# Patient Record
Sex: Male | Born: 1985
Health system: Southern US, Community
[De-identification: ages and names within clinical notes are randomized; demographics above are authoritative.]

## PROBLEM LIST (undated history)

## (undated) DIAGNOSIS — J45909 Unspecified asthma, uncomplicated: Secondary | ICD-10-CM

---

## 2008-01-10 ENCOUNTER — Emergency Department (HOSPITAL_COMMUNITY): Admission: EM | Admit: 2008-01-10 | Discharge: 2008-01-10 | Payer: Self-pay | Admitting: Emergency Medicine

## 2009-10-04 ENCOUNTER — Encounter: Admission: RE | Admit: 2009-10-04 | Discharge: 2009-10-04 | Payer: Self-pay | Admitting: Infectious Diseases

## 2016-01-23 DIAGNOSIS — J4541 Moderate persistent asthma with (acute) exacerbation: Secondary | ICD-10-CM | POA: Diagnosis not present

## 2016-12-23 ENCOUNTER — Encounter: Payer: Self-pay | Admitting: Emergency Medicine

## 2016-12-23 ENCOUNTER — Emergency Department
Admission: EM | Admit: 2016-12-23 | Discharge: 2016-12-23 | Disposition: A | Discharge status: Home / Self Care | Payer: BLUE CROSS/BLUE SHIELD | Attending: Emergency Medicine | Admitting: Emergency Medicine

## 2016-12-23 DIAGNOSIS — R0603 Acute respiratory distress: Secondary | ICD-10-CM | POA: Diagnosis not present

## 2016-12-23 DIAGNOSIS — J4551 Severe persistent asthma with (acute) exacerbation: Secondary | ICD-10-CM | POA: Diagnosis not present

## 2016-12-23 HISTORY — DX: Unspecified asthma, uncomplicated: J45.909

## 2016-12-23 MED ORDER — IPRATROPIUM-ALBUTEROL 0.5-2.5 (3) MG/3ML IN SOLN
9.0000 mL | Freq: Once | RESPIRATORY_TRACT | Status: AC
  Administered 2016-12-23: 9 mL via RESPIRATORY_TRACT

## 2016-12-23 MED ORDER — MAGNESIUM SULFATE 4 GM/100ML IV SOLN
INTRAVENOUS | Status: AC
  Administered 2016-12-23: 2 g via INTRAVENOUS
  Filled 2016-12-23: qty 100

## 2016-12-23 MED ORDER — METHYLPREDNISOLONE SODIUM SUCC 125 MG IJ SOLR
125.0000 mg | Freq: Once | INTRAMUSCULAR | Status: AC
  Administered 2016-12-23: 125 mg via INTRAVENOUS

## 2016-12-23 MED ORDER — ALBUTEROL SULFATE HFA 108 (90 BASE) MCG/ACT IN AERS: 2.0000 | INHALATION_SPRAY | Freq: Four times a day (QID) | RESPIRATORY_TRACT | 0 refills | Status: AC | PRN

## 2016-12-23 MED ORDER — PREDNISONE 20 MG PO TABS: 60.0000 mg | ORAL_TABLET | Freq: Every day | ORAL | 0 refills | Status: AC

## 2016-12-23 MED ORDER — METHYLPREDNISOLONE SODIUM SUCC 125 MG IJ SOLR
INTRAMUSCULAR | Status: AC
  Administered 2016-12-23: 125 mg via INTRAVENOUS
  Filled 2016-12-23: qty 2

## 2016-12-23 MED ORDER — MAGNESIUM SULFATE 2 GM/50ML IV SOLN
2.0000 g | Freq: Once | INTRAVENOUS | Status: AC
  Administered 2016-12-23: 2 g via INTRAVENOUS
  Filled 2016-12-23: qty 50

## 2016-12-23 NOTE — ED Notes (Signed)
Pt resting in bed playing on phone. VSS. Will continue to monitor for further patient needs.

## 2016-12-23 NOTE — ED Notes (Signed)
Pt visitor at bedside at this time. Pt is noted to be sitting up in bed speaking in complete sentences at this time without difficulty.

## 2016-12-23 NOTE — ED Notes (Signed)
NAD noted at time of D/C. Pt denies questions or concerns. Pt ambulatory to the lobby at this time.  

## 2016-12-23 NOTE — ED Triage Notes (Signed)
Pt bib employer from work d/t asthma exacerbation. Pt pulled from vehicle, unable to speak in complete sentences, pt taken to room 10. MD at bedside, respiratory called, nurse and EDT in room. Pts RR 41.

## 2016-12-23 NOTE — ED Notes (Signed)
This RN to bedside at this time. Pt in bed doing a breathing treatment. Pt able to nod head to answer questions.

## 2016-12-23 NOTE — ED Provider Notes (Addendum)
[facility] Emergency Department Provider Note   ____________________________________________   First MD Initiated Contact with Patient 12/23/16 [phone]     (approximate)  I have reviewed the triage vital signs and the nursing notes.   HISTORY  Chief Complaint Respiratory Distress  History Limited by respiratory distress  HPI Eddie Lawrence is a 31 y.o. male who was brought in to the emergency department with severe respiratory distress. The patient was wheeled back to a room and unable to speak. According to staff he was dropped off by his boss at work. The patient does have a history of asthma and ran out of his albuterol inhaler. He reports that he had been having some shortness of breath for a few hours but it became acutely worse. He had planned to go to urgent care to get a refill but was unable to make it. The patient denies any cough or fevers and had been feeling fine otherwise. He has no chest pain no nausea no vomiting. When he initially arrived again he was in some severe acute respiratory distress.   Past Medical History:  Diagnosis Date  . Asthma     There are no active problems to display for this patient.   History reviewed. No pertinent surgical history.  Prior to Admission medications   Medication Sig Start Date End Date Taking? Authorizing Provider  albuterol (PROVENTIL HFA;VENTOLIN HFA) 108 (90 Base) MCG/ACT inhaler Inhale 2 puffs into the lungs every 6 (six) hours as needed. 12/23/16   Rebecka Apley, MD  predniSONE (DELTASONE) 20 MG tablet Take 3 tablets (60 mg total) by mouth daily. 12/23/16   Rebecka Apley, MD    Allergies Patient has no known allergies.  No family history on file.  Social History Social History  Substance Use Topics  . Smoking status: Never Smoker  . Smokeless tobacco: Not on file  . Alcohol use Yes    Review of Systems  Constitutional: No fever/chills Eyes: No visual changes. ENT: No  sore throat. Cardiovascular: Denies chest pain. Respiratory:  shortness of breath. Gastrointestinal: No abdominal pain.  No nausea, no vomiting.  No diarrhea.  No constipation. Genitourinary: Negative for dysuria. Musculoskeletal: Negative for back pain. Skin: Negative for rash. Neurological: Negative for headaches, focal weakness or numbness.   ____________________________________________   PHYSICAL EXAM:  VITAL SIGNS: ED Triage Vitals [12/23/16 0658]  Enc Vitals Group     BP      Pulse Rate 98     Resp 39     Temp      Temp src      SpO2 100 %     Weight      Height      Head Circumference      Peak Flow      Pain Score      Pain Loc      Pain Edu?      Excl. in GC?     Constitutional: Alert and oriented. the patient is disoriented appearing in severe respiratory distress Eyes: Conjunctivae are normal. PERRL. EOMI. Head: Atraumatic. Nose: No congestion/rhinnorhea. Mouth/Throat: Mucous membranes are moist.  Oropharynx non-erythematous. Cardiovascular: tachycardia, regular rhythm. Grossly normal heart sounds.  Good peripheral circulation. Respiratory: increased respiratory effort. subcostal retractions. severe expiratory wheezes in all lung fields Gastrointestinal: Soft and nontender. No distention. Positive bowel sounds Musculoskeletal: No lower extremity tenderness nor edema.   Neurologic:  the patient is unable to speak due to his respiratory distress Skin:  Skin is  warm, dry and intact.  Psychiatric: Mood and affect are normal.   ____________________________________________   LABS (all labs ordered are listed, but only abnormal results are displayed)  Labs Reviewed - No data to display ____________________________________________  EKG  ED ECG REPORT I, Rebecka Apley, the attending physician, personally viewed and interpreted this ECG.   Date: 12/23/2016  EKG Time: 657  Rate: 96  Rhythm: normal sinus rhythm  Axis: normal  Intervals:none  ST&T  Change: none  ____________________________________________  RADIOLOGY  No results found.  ____________________________________________   PROCEDURES  Procedure(s) performed: None  Procedures  Critical Care performed: Yes, see critical care note(s)   CRITICAL CARE Performed by: Lucrezia Europe P   Total critical care time: 30 minutes  Critical care time was exclusive of separately billable procedures and treating other patients.  Critical care was necessary to treat or prevent imminent or life-threatening deterioration.  Critical care was time spent personally by me on the following activities: development of treatment plan with patient and/or surrogate as well as nursing, discussions with consultants, evaluation of patient's response to treatment, examination of patient, obtaining history from patient or surrogate, ordering and performing treatments and interventions, ordering and review of laboratory studies, ordering and review of radiographic studies, pulse oximetry and re-evaluation of patient's condition.   ____________________________________________   INITIAL IMPRESSION / ASSESSMENT AND PLAN / ED COURSE  Pertinent labs & imaging results that were available during my care of the patient were reviewed by me and considered in my medical decision making (see chart for details).  this is a 31 year old male who comes into the hospital today with shortness of breath the patient has a history of asthma and I fear that the patient is having a severe asthma exacerbation.  when the patient arrived in the emergency department I quickly assessed if he had any allergies and he denied. We immediately started 3 DuoNeb treatments on the patient. We started an IV and gave him a dose of Solu-Medrol as well as some magnesium sulfate. After approximately 15 minutes the patient's breathing did slow and he reports that his breathing was feeling improved. The patient states that he did not  have any cough or any chest pain or any other symptoms. He reports that this was truly has asthma. Given the patient's history and his rapid improvement we did not check any blood work or any x-rays. After receiving the medications the patient's breathing difficulty was completely resolved and the patient states that he was ready to be discharged home. I will write him for some prednisone as well as an inhaler for home. The patient will be discharged. his asthma attack has completely resolved.      ____________________________________________   FINAL CLINICAL IMPRESSION(S) / ED DIAGNOSES  Final diagnoses:  Severe persistent asthma with exacerbation      NEW MEDICATIONS STARTED DURING THIS VISIT:  New Prescriptions   ALBUTEROL (PROVENTIL HFA;VENTOLIN HFA) 108 (90 BASE) MCG/ACT INHALER    Inhale 2 puffs into the lungs every 6 (six) hours as needed.   PREDNISONE (DELTASONE) 20 MG TABLET    Take 3 tablets (60 mg total) by mouth daily.     Note:  This document was prepared using Dragon voice recognition software and may include unintentional dictation errors.    Rebecka Apley, MD 12/23/16 1610    Rebecka Apley, MD 12/23/16 [phone]

## 2016-12-23 NOTE — Discharge Instructions (Signed)
Please follow up with the acute care clinic for further evaluation of your asthma. You may need a controller medication. At this time your symptoms are improved. Please use your albuterol inhaler every 6 hours for the next 24 hours to keep your airways open.

## 2017-01-10 ENCOUNTER — Encounter (HOSPITAL_COMMUNITY): Payer: Self-pay

## 2017-01-10 ENCOUNTER — Emergency Department (HOSPITAL_COMMUNITY)
Admission: EM | Admit: 2017-01-10 | Discharge: 2017-01-10 | Disposition: A | Discharge status: Home / Self Care | Payer: BLUE CROSS/BLUE SHIELD | Attending: Emergency Medicine | Admitting: Emergency Medicine

## 2017-01-10 ENCOUNTER — Emergency Department (HOSPITAL_COMMUNITY): Payer: BLUE CROSS/BLUE SHIELD

## 2017-01-10 DIAGNOSIS — Y929 Unspecified place or not applicable: Secondary | ICD-10-CM | POA: Insufficient documentation

## 2017-01-10 DIAGNOSIS — Y9389 Activity, other specified: Secondary | ICD-10-CM | POA: Diagnosis not present

## 2017-01-10 DIAGNOSIS — S42331A Displaced oblique fracture of shaft of humerus, right arm, initial encounter for closed fracture: Secondary | ICD-10-CM

## 2017-01-10 DIAGNOSIS — S4991XA Unspecified injury of right shoulder and upper arm, initial encounter: Secondary | ICD-10-CM | POA: Diagnosis not present

## 2017-01-10 DIAGNOSIS — Z79899 Other long term (current) drug therapy: Secondary | ICD-10-CM | POA: Diagnosis not present

## 2017-01-10 DIAGNOSIS — J45909 Unspecified asthma, uncomplicated: Secondary | ICD-10-CM | POA: Diagnosis not present

## 2017-01-10 DIAGNOSIS — Y998 Other external cause status: Secondary | ICD-10-CM | POA: Diagnosis not present

## 2017-01-10 DIAGNOSIS — R2231 Localized swelling, mass and lump, right upper limb: Secondary | ICD-10-CM | POA: Diagnosis not present

## 2017-01-10 DIAGNOSIS — M79621 Pain in right upper arm: Secondary | ICD-10-CM | POA: Diagnosis not present

## 2017-01-10 DIAGNOSIS — R52 Pain, unspecified: Secondary | ICD-10-CM

## 2017-01-10 MED ORDER — OXYCODONE-ACETAMINOPHEN 5-325 MG PO TABS
1.0000 | ORAL_TABLET | ORAL | Status: DC | PRN
  Administered 2017-01-10: 1 via ORAL

## 2017-01-10 MED ORDER — OXYCODONE-ACETAMINOPHEN 5-325 MG PO TABS: 1.0000 | ORAL_TABLET | Freq: Four times a day (QID) | ORAL | 0 refills | Status: AC | PRN

## 2017-01-10 MED ORDER — OXYCODONE-ACETAMINOPHEN 5-325 MG PO TABS
ORAL_TABLET | ORAL | Status: AC
  Filled 2017-01-10: qty 1

## 2017-01-10 MED ORDER — IBUPROFEN 600 MG PO TABS: 600.0000 mg | ORAL_TABLET | Freq: Four times a day (QID) | ORAL | 0 refills | Status: AC | PRN

## 2017-01-10 NOTE — Discharge Instructions (Signed)
Please read and follow all provided instructions.  Your diagnoses today include:  1. Pain   2. Closed displaced oblique fracture of shaft of right humerus, initial encounter     Tests performed today include:  An x-ray of the affected area - does NOT show any broken bones  Vital signs. See below for your results today.   Medications prescribed:   Ibuprofen (Motrin, Advil) - anti-inflammatory pain medication  Do not exceed  ibuprofen every 6 hours, take with food  You have been prescribed an anti-inflammatory medication or NSAID. Take with food. Take smallest effective dose for the shortest duration needed for your pain. Stop taking if you experience stomach pain or vomiting.    Percocet (oxycodone/acetaminophen) - narcotic pain medication  DO NOT drive or perform any activities that require you to be awake and alert because this medicine can make you drowsy. BE VERY CAREFUL not to take multiple medicines containing Tylenol (also called acetaminophen). Doing so can lead to an overdose which can damage your liver and cause liver failure and possibly death.  Take any prescribed medications only as directed.  Home care instructions:   Follow any educational materials contained in this packet  Follow R.I.C.E. Protocol:  R - rest your injury   I  - use ice on injury without applying directly to skin  C - compress injury with bandage or splint  E - elevate the injury as much as possible  Follow-up instructions: Please follow-up the provided orthopedic physician as directed.   Return instructions:   Please return if your fingers are numb or tingling, appear gray or blue, or you have severe pain (also elevate the arm and loosen splint or wrap if you were given one)  Please return to the Emergency Department if you experience worsening symptoms.   Please return if you have any other emergent concerns.  Additional Information:  Your vital signs today were: BP (!)  143/91 (BP Location: Left Arm)    Pulse 79    Temp 98.5 F (36.9 C) (Oral)    Resp 16    SpO2 100%  If your blood pressure (BP) was elevated above 135/85 this visit, please have this repeated by your doctor within one month. --------------
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
## 2017-01-10 NOTE — Progress Notes (Signed)
Orthopedic Tech Progress Note Patient Details:  Eddie Lawrence [birth year] [id number]  Ortho Devices Type of Ortho Device: Ace wrap, Sling immobilizer, Coapt, Stirrup splint Ortho Device/Splint Location: rue Ortho Device/Splint Interventions: Application   Eddie Lawrence 01/10/2017, 9:32 AM

## 2017-01-12 DIAGNOSIS — S42301A Unspecified fracture of shaft of humerus, right arm, initial encounter for closed fracture: Secondary | ICD-10-CM | POA: Diagnosis not present

## 2017-01-14 DIAGNOSIS — M79601 Pain in right arm: Secondary | ICD-10-CM | POA: Diagnosis not present

## 2017-01-19 DIAGNOSIS — S42301D Unspecified fracture of shaft of humerus, right arm, subsequent encounter for fracture with routine healing: Secondary | ICD-10-CM | POA: Diagnosis not present

## 2017-01-21 ENCOUNTER — Encounter: Payer: Self-pay | Admitting: Physical Therapy

## 2017-01-21 ENCOUNTER — Ambulatory Visit: Payer: BLUE CROSS/BLUE SHIELD | Attending: Student | Admitting: Physical Therapy

## 2017-01-21 DIAGNOSIS — M6281 Muscle weakness (generalized): Secondary | ICD-10-CM | POA: Diagnosis not present

## 2017-01-21 DIAGNOSIS — R600 Localized edema: Secondary | ICD-10-CM | POA: Insufficient documentation

## 2017-01-21 DIAGNOSIS — M79621 Pain in right upper arm: Secondary | ICD-10-CM | POA: Insufficient documentation

## 2017-01-21 DIAGNOSIS — M25511 Pain in right shoulder: Secondary | ICD-10-CM

## 2017-01-21 DIAGNOSIS — M25521 Pain in right elbow: Secondary | ICD-10-CM | POA: Diagnosis not present

## 2017-01-21 NOTE — Therapy (Signed)
[facility] [address] [place], [place], [address] Phone: [phone]   Fax:  [phone]  Physical Therapy Evaluation  Patient Details  Name: Eddie Lawrence MRN: [id number] Date of Birth: [birth year] Referring Provider: Roby Lofts, MD  Encounter Date: 01/21/2017      PT End of Session - 01/21/17 1539    Visit Number 1   Number of Visits 21   Date for PT Re-Evaluation 04/02/17   Authorization Type BCBS   PT Start Time 1540   PT Stop Time 1639   PT Time Calculation (min) 59 min   Activity Tolerance Patient limited by pain;Patient tolerated treatment well   Behavior During Therapy [facility] for tasks assessed/performed      Past Medical History:  Diagnosis Date  . Asthma     History reviewed. No pertinent surgical history.  There were no vitals filed for this visit.       Subjective Assessment - 01/21/17 1543    Subjective pt reports he is 2 weeks post injury, understands it will take 6-8 weeks to heal. It feels almost impossible to straighten elbow. Denies N/T in upper extremity.    Patient Stated Goals using arm, extend elbow, works at [employer]   Currently in Pain? Yes   Pain Score 4    Pain Location Arm   Pain Orientation Right;Mid   Pain Descriptors / Indicators Sharp   Aggravating Factors  moving arm, quick movements, sneezing   Pain Relieving Factors ice            [facility] PT Assessment - 01/21/17 0001      Assessment   Medical Diagnosis R humerus fracture   Referring Provider Haddix, Gillie Manners, MD   Onset Date/Surgical Date --  2 weeks   Hand Dominance Right   Next MD Visit 11/6   Prior Therapy no     Precautions   Precautions Shoulder   Type of Shoulder Precautions R UE NWB     Restrictions   Weight Bearing Restrictions Yes   RUE Weight Bearing Non weight bearing     Balance Screen   Has the patient fallen in the past 6 months No     Home Environment   Living Environment Private  residence     Prior Function   Level of Independence Independent   Vocation Requirements [employer]     Cognition   Overall Cognitive Status Within Functional Limits for tasks assessed     Observation/Other Assessments   Focus on Therapeutic Outcomes (FOTO)  72% limitation     Sensation   Additional Comments WFL     ROM / Strength   AROM / PROM / Strength PROM     PROM   PROM Assessment Site Shoulder;Elbow   Right/Left Shoulder Right   Right Shoulder Flexion 50 Degrees  empty end feel limited by pain   Right Shoulder ABduction 60 Degrees  pain empty end feel   Right Shoulder External Rotation 0 Degrees  pain, empty end feel   Right/Left Elbow Right   Right Elbow Flexion 90   Right Elbow Extension -75     Palpation   Palpation comment pitting edema notable in R elbow/arm            Objective measurements completed on examination: See above findings.          [facility] Adult PT Treatment/Exercise - 01/21/17 0001      Exercises   Exercises Elbow  Elbow Exercises   Elbow Extension Limitations self passive in supine, elbow on 2 pillows   Other elbow exercises putty grip     Modalities   Modalities Vasopneumatic     Vasopneumatic   Number Minutes Vasopneumatic  15 minutes   Vasopnuematic Location  Other (comment)  Elbow   Vasopneumatic Pressure Low   Vasopneumatic Temperature  34     Manual Therapy   Manual Therapy Edema management;Passive ROM   Edema Management forearm & elbow   Passive ROM elbow & shoulder                PT Education - 01/21/17 1722    Education provided Yes   Education Details anatomy of condition, POC, HEP, exercise form/rationale   Person(s) Educated Patient   Methods Explanation;Demonstration;Tactile cues;Verbal cues   Comprehension Verbalized understanding;Returned demonstration;Verbal cues required;Tactile cues required;Need further instruction          PT Short Term Goals - 01/21/17 1728      PT SHORT TERM  GOAL #1   Title passive GHJ & elbow to full ROM v L side   Baseline significantly limited-see flowsheet   Time 6   Period Weeks   Status New   Target Date 03/04/17           PT Long Term Goals - 01/21/17 1729      PT LONG TERM GOAL #1   Title Pt will be able to carry objects such as plates of food and grocery bags without limitation by arm pain   Baseline unable at eval   Time 10   Period Weeks   Status New   Target Date 04/02/17     PT LONG TERM GOAL #2   Title Gross UE strength to 5/5 for proper support to biomechanical chain   Baseline unable to test at eval due to restrictions   Time 10   Period Weeks   Status New   Target Date 04/02/17     PT LONG TERM GOAL #3   Title Pt will be able to appropraitely use arm for all self care and work activities   Baseline unable at eval   Time 10   Period Weeks   Status New   Target Date 04/02/17     PT LONG TERM GOAL #4   Title FOTO to 29% limitation to indicate significant improvement in functional ability   Baseline 72% limited at eval   Time 10   Period Weeks   Status New   Target Date 04/02/17                Plan - 01/21/17 1723    Clinical Impression Statement Pt presents to PT with complaints of R arm pain and limited functional use 2 weeks post mid-shaft humerus oblique fracture. Pt with referral for aggressive PROM of elbow & shoulder. Pt is wearing a removable brace onupper arm which I told him he can remove to shower. significant pitting edema noted at elbow and distally, educated on importance of elevating and activating musculature. Pt was very nervous about passive movements today, all ranges with empty end feel and painful. Pt will benefit from skilled PT in order to improve functional use of dominant upper extremity.    History and Personal Factors relevant to plan of care: none   Clinical Presentation Stable   Clinical Presentation due to: n/a   Clinical Decision Making Low   Rehab Potential Good    PT Frequency 2x / week  PT Duration Other (comment)  10 weeks   PT Treatment/Interventions ADLs/Self Care Home Management;Cryotherapy;Electrical Stimulation;Iontophoresis 4mg /ml Dexamethasone;Functional mobility training;Ultrasound;Moist Heat;Therapeutic activities;Therapeutic exercise;Neuromuscular re-education;Patient/family education;Passive range of motion;Manual techniques;Dry needling;Taping;Vasopneumatic Device   PT Next Visit Plan passive elbow & shoulder ROM, wrist/forearm exercises, scapular retractions/cervical stretches   PT Home Exercise Plan putty grip, self passive elbow extension   Consulted and Agree with Plan of Care Patient      Patient will benefit from skilled therapeutic intervention in order to improve the following deficits and impairments:  Decreased range of motion, Impaired UE functional use, Increased muscle spasms, Decreased activity tolerance, Pain, Improper body mechanics, Impaired flexibility, Decreased strength, Decreased mobility, Postural dysfunction, Increased edema  Visit Diagnosis: Pain in right upper arm - Plan: PT plan of care cert/re-cert  Acute pain of right shoulder - Plan: PT plan of care cert/re-cert  Pain in right elbow - Plan: PT plan of care cert/re-cert  Muscle weakness (generalized) - Plan: PT plan of care cert/re-cert  Localized edema - Plan: PT plan of care cert/re-cert     Problem List There are no active problems to display for this patient.   Angalena Cousineau C. Taleeyah Bora PT, DPT 01/21/17 5:34 PM   [facility] [address] [place], [place], [address] Phone: [phone]   Fax:  [phone]  Name: Eddie Lawrence MRN: [id number] Date of Birth: 1985/10/03

## 2017-01-26 DIAGNOSIS — Z0000 Encounter for general adult medical examination without abnormal findings: Secondary | ICD-10-CM | POA: Diagnosis not present

## 2017-01-26 DIAGNOSIS — Z113 Encounter for screening for infections with a predominantly sexual mode of transmission: Secondary | ICD-10-CM | POA: Diagnosis not present

## 2017-01-26 DIAGNOSIS — Z136 Encounter for screening for cardiovascular disorders: Secondary | ICD-10-CM | POA: Diagnosis not present

## 2017-01-27 ENCOUNTER — Ambulatory Visit: Payer: BLUE CROSS/BLUE SHIELD | Admitting: Physical Therapy

## 2017-01-27 DIAGNOSIS — M6281 Muscle weakness (generalized): Secondary | ICD-10-CM

## 2017-01-27 DIAGNOSIS — M25521 Pain in right elbow: Secondary | ICD-10-CM | POA: Diagnosis not present

## 2017-01-27 DIAGNOSIS — M25511 Pain in right shoulder: Secondary | ICD-10-CM

## 2017-01-27 DIAGNOSIS — R600 Localized edema: Secondary | ICD-10-CM | POA: Diagnosis not present

## 2017-01-27 DIAGNOSIS — M79621 Pain in right upper arm: Secondary | ICD-10-CM

## 2017-01-28 ENCOUNTER — Ambulatory Visit: Payer: BLUE CROSS/BLUE SHIELD | Attending: Student | Admitting: Physical Therapy

## 2017-01-28 ENCOUNTER — Encounter: Payer: Self-pay | Admitting: Physical Therapy

## 2017-01-28 DIAGNOSIS — M79621 Pain in right upper arm: Secondary | ICD-10-CM | POA: Diagnosis not present

## 2017-01-28 DIAGNOSIS — M6281 Muscle weakness (generalized): Secondary | ICD-10-CM | POA: Insufficient documentation

## 2017-01-28 DIAGNOSIS — M25521 Pain in right elbow: Secondary | ICD-10-CM | POA: Diagnosis not present

## 2017-01-28 DIAGNOSIS — M25511 Pain in right shoulder: Secondary | ICD-10-CM | POA: Diagnosis not present

## 2017-01-28 DIAGNOSIS — R600 Localized edema: Secondary | ICD-10-CM | POA: Diagnosis not present

## 2017-01-28 NOTE — Therapy (Signed)
[facility] [address] [place], [place], [address] Phone: [phone]   Fax:  [phone]  Physical Therapy Treatment  Patient Details  Name: Eddie Lawrence MRN: [id number] Date of Birth: 05/04/1985 Referring Provider: Roby LoftsHaddix, Kevin P, MD  Encounter Date: 01/27/2017      PT End of Session - 01/27/17 1441    Visit Number 2   Number of Visits 21   Date for PT Re-Evaluation 04/02/17   Authorization Type BCBS   PT Start Time 1415   PT Stop Time 1505   PT Time Calculation (min) 50 min   Activity Tolerance Patient tolerated treatment well   Behavior During Therapy [facility] for tasks assessed/performed      Past Medical History:  Diagnosis Date  . Asthma     No past surgical history on file.  There were no vitals filed for this visit.      Subjective Assessment - 01/27/17 1420    Subjective Patient reports his pain has been pretty well under control. It is about a 5/10 at this time. He has been working on squeezing at home. He has moderate swelling of the elbow and into the forearm    Limitations Lifting   Patient Stated Goals using arm, extend elbow, works at [employer]   Currently in Pain? Yes   Pain Score 5    Pain Location Arm   Pain Orientation Right;Mid   Pain Descriptors / Indicators Sharp   Pain Type Acute pain   Pain Onset More than a month ago   Pain Frequency Constant   Aggravating Factors  mvoing the arm    Pain Relieving Factors ice                          [facility] Adult PT Treatment/Exercise - 01/28/17 0001      Elbow Exercises   Elbow Extension Limitations AA ROm using roller and AAROM using his opposite hand up around the left elbow    Other elbow exercises putty grip; putty pinch x10; gripper x5 each hand      Modalities   Modalities Cryotherapy     Cryotherapy   Number Minutes Cryotherapy 10 Minutes  in elevated position    Cryotherapy Location Forearm   Type of Cryotherapy  Ice pack     Manual Therapy   Manual Therapy Edema management;Passive ROM   Edema Management forearm & elbow   Passive ROM elbow & shoulder                PT Education - 01/27/17 1435    Education provided Yes   Education Details reviewe AAROM of the elbow into extension   Person(s) Educated Patient   Methods Explanation;Demonstration;Tactile cues;Verbal cues   Comprehension Verbalized understanding;Verbal cues required;Returned demonstration;Tactile cues required;Need further instruction          PT Short Term Goals - 01/21/17 1728      PT SHORT TERM GOAL #1   Title passive GHJ & elbow to full ROM v L side   Baseline significantly limited-see flowsheet   Time 6   Period Weeks   Status New   Target Date 03/04/17           PT Long Term Goals - 01/21/17 1729      PT LONG TERM GOAL #1   Title Pt will be able to carry objects such as plates of food and grocery bags without limitation by arm pain  Baseline unable at eval   Time 10   Period Weeks   Status New   Target Date 04/02/17     PT LONG TERM GOAL #2   Title Gross UE strength to 5/5 for proper support to biomechanical chain   Baseline unable to test at eval due to restrictions   Time 10   Period Weeks   Status New   Target Date 04/02/17     PT LONG TERM GOAL #3   Title Pt will be able to appropraitely use arm for all self care and work activities   Baseline unable at eval   Time 10   Period Weeks   Status New   Target Date 04/02/17     PT LONG TERM GOAL #4   Title FOTO to 29% limitation to indicate significant improvement in functional ability   Baseline 72% limited at eval   Time 10   Period Weeks   Status New   Target Date 04/02/17               Plan - 01/27/17 1506    Clinical Impression Statement Patient is very stiff and swollen. Therapy worked on ROM to tolerance. Therapy also reviewed self stretching for his elbow. He was encouraged to perfrom 3-4x a day. He was limited by  pain. He was also shown how to put R UE in a position to improve edema.     Clinical Presentation Stable   Clinical Decision Making Low   Rehab Potential Good   PT Frequency 2x / week   PT Duration Other (comment)   PT Treatment/Interventions ADLs/Self Care Home Management;Cryotherapy;Electrical Stimulation;Iontophoresis 4mg /ml Dexamethasone;Functional mobility training;Ultrasound;Moist Heat;Therapeutic activities;Therapeutic exercise;Neuromuscular re-education;Patient/family education;Passive range of motion;Manual techniques;Dry needling;Taping;Vasopneumatic Device   PT Next Visit Plan passive elbow & shoulder ROM, wrist/forearm exercises, scapular retractions/cervical stretches; Continue to progress as tolerated.    PT Home Exercise Plan putty grip, self passive elbow extension   Consulted and Agree with Plan of Care Patient      Patient will benefit from skilled therapeutic intervention in order to improve the following deficits and impairments:  Decreased range of motion, Impaired UE functional use, Increased muscle spasms, Decreased activity tolerance, Pain, Improper body mechanics, Impaired flexibility, Decreased strength, Decreased mobility, Postural dysfunction, Increased edema  Visit Diagnosis: Pain in right upper arm  Acute pain of right shoulder  Pain in right elbow  Muscle weakness (generalized)  Localized edema     Problem List There are no active problems to display for this patient.   Dessie Coma PT DPT 01/28/2017, 7:55 AM  [facility] [address] [place], [place], [address] Phone: [phone]   Fax:  [phone]  Name: Eddie Lawrence MRN: [id number] Date of Birth: 01-26-1986

## 2017-01-28 NOTE — Therapy (Signed)
[facility] [address] [place], [place], [address] Phone: [phone]   Fax:  [phone]  Physical Therapy Treatment  Patient Details  Name: Eddie Lawrence MRN: [id number] Date of Birth: Jan 22, 1986 Referring Provider: Roby Lofts, MD  Encounter Date: 01/28/2017      PT End of Session - 01/28/17 1502    Visit Number 3   Number of Visits 21   Date for PT Re-Evaluation 04/02/17   Authorization Type BCBS   PT Start Time 1502   PT Stop Time 1543   PT Time Calculation (min) 41 min   Activity Tolerance Patient tolerated treatment well   Behavior During Therapy [facility] for tasks assessed/performed      Past Medical History:  Diagnosis Date  . Asthma     History reviewed. No pertinent surgical history.  There were no vitals filed for this visit.      Subjective Assessment - 01/28/17 1503    Subjective Reports it is feeling okay, I feel good. Sling is beginning to bother neck and R shoulder. Tried to elevate arm at home. Feels that elbow pain at hinge has resolved, more pain noted on posterior aspect of elbow.    Patient Stated Goals using arm, extend elbow, works at [employer]   Currently in Pain? Yes   Pain Score 3    Pain Location Arm   Pain Orientation Right   Aggravating Factors  movement of arm   Pain Relieving Factors rest, ice            [facility] PT Assessment - 01/28/17 0001      PROM   Right Elbow Extension -40                     [facility] Adult PT Treatment/Exercise - 01/28/17 1545      Cryotherapy   Number Minutes Cryotherapy --  ice pack on upper arm through treatment     Manual Therapy   Edema Management forearm & elbow   Passive ROM Elbow flex/ext, pronation/supination                PT Education - 01/27/17 1435    Education provided Yes   Education Details reviewe AAROM of the elbow into extension   Person(s) Educated Patient   Methods Explanation;Demonstration;Tactile  cues;Verbal cues   Comprehension Verbalized understanding;Verbal cues required;Returned demonstration;Tactile cues required;Need further instruction          PT Short Term Goals - 01/21/17 1728      PT SHORT TERM GOAL #1   Title passive GHJ & elbow to full ROM v L side   Baseline significantly limited-see flowsheet   Time 6   Period Weeks   Status New   Target Date 03/04/17           PT Long Term Goals - 01/21/17 1729      PT LONG TERM GOAL #1   Title Pt will be able to carry objects such as plates of food and grocery bags without limitation by arm pain   Baseline unable at eval   Time 10   Period Weeks   Status New   Target Date 04/02/17     PT LONG TERM GOAL #2   Title Gross UE strength to 5/5 for proper support to biomechanical chain   Baseline unable to test at eval due to restrictions   Time 10   Period Weeks   Status New   Target Date 04/02/17  PT LONG TERM GOAL #3   Title Pt will be able to appropraitely use arm for all self care and work activities   Baseline unable at eval   Time 10   Period Weeks   Status New   Target Date 04/02/17     PT LONG TERM GOAL #4   Title FOTO to 29% limitation to indicate significant improvement in functional ability   Baseline 72% limited at eval   Time 10   Period Weeks   Status New   Target Date 04/02/17               Plan - 01/28/17 1510    Clinical Impression Statement Pt arm was very warm today, heat felt through brace. cont to demo significant swelling and stiffness along arm. Ice pack wrapped around upper arm during treatment today. Able to extend elbow to -40 today improved from -75. Seeing MD next Tuesday   PT Treatment/Interventions ADLs/Self Care Home Management;Cryotherapy;Electrical Stimulation;Iontophoresis 4mg /ml Dexamethasone;Functional mobility training;Ultrasound;Moist Heat;Therapeutic activities;Therapeutic exercise;Neuromuscular re-education;Patient/family education;Passive range of  motion;Manual techniques;Dry needling;Taping;Vasopneumatic Device   PT Next Visit Plan passive elbow & shoulder ROM, wrist/forearm exercises, scapular retractions/cervical stretches; Continue to progress as tolerated.    PT Home Exercise Plan putty grip, self passive elbow extension      Patient will benefit from skilled therapeutic intervention in order to improve the following deficits and impairments:  Decreased range of motion, Impaired UE functional use, Increased muscle spasms, Decreased activity tolerance, Pain, Improper body mechanics, Impaired flexibility, Decreased strength, Decreased mobility, Postural dysfunction, Increased edema  Visit Diagnosis: Pain in right upper arm  Acute pain of right shoulder  Pain in right elbow  Muscle weakness (generalized)  Localized edema     Problem List There are no active problems to display for this patient.   Eliav Mechling C. Kirbi Farrugia PT, DPT 01/28/17 3:46 PM   [facility] [address] [place], [place], [address] Phone: [phone]   Fax:  [phone]  Name: Eddie Lawrence MRN: [id number] Date of Birth: 07/22/1985

## 2017-02-01 ENCOUNTER — Ambulatory Visit: Payer: BLUE CROSS/BLUE SHIELD | Admitting: Physical Therapy

## 2017-02-01 ENCOUNTER — Encounter: Payer: Self-pay | Admitting: Physical Therapy

## 2017-02-01 DIAGNOSIS — M6281 Muscle weakness (generalized): Secondary | ICD-10-CM | POA: Diagnosis not present

## 2017-02-01 DIAGNOSIS — M79621 Pain in right upper arm: Secondary | ICD-10-CM

## 2017-02-01 DIAGNOSIS — M25511 Pain in right shoulder: Secondary | ICD-10-CM | POA: Diagnosis not present

## 2017-02-01 DIAGNOSIS — R600 Localized edema: Secondary | ICD-10-CM

## 2017-02-01 DIAGNOSIS — M25521 Pain in right elbow: Secondary | ICD-10-CM

## 2017-02-01 NOTE — Therapy (Signed)
[facility] [address] [place], [place], [address] Phone: [phone]   Fax:  [phone]  Physical Therapy Treatment  Patient Details  Name: Eddie Lawrence MRN: [id number] Date of Birth: Jan 29, 1986 Referring Provider: Roby Lofts, MD   Encounter Date: 02/01/2017  PT End of Session - 02/01/17 1509    Visit Number  4    Number of Visits  21    Date for PT Re-Evaluation  04/02/17    Authorization Type  BCBS    PT Start Time  1508    PT Stop Time  1551    PT Time Calculation (min)  43 min    Activity Tolerance  Patient limited by pain    Behavior During Therapy  [facility] for tasks assessed/performed       Past Medical History:  Diagnosis Date  . Asthma     History reviewed. No pertinent surgical history.  There were no vitals filed for this visit.  Subjective Assessment - 02/01/17 1510    Subjective  Reports feeling "not bad"  Has been stretching and moving at home. Is elevating more. Shoulder and elbow are sore.     Patient Stated Goals  using arm, extend elbow, works at [employer]    Currently in Pain?  Yes    Pain Score  2     Pain Location  Arm    Pain Orientation  Right    Pain Radiating Towards  R shoulder and elbow                      [facility] Adult PT Treatment/Exercise - 02/01/17 0001      Cryotherapy   Number Minutes Cryotherapy  10 Minutes    Cryotherapy Location  Other (comment) elbow   elbow   Type of Cryotherapy  Ice pack      Manual Therapy   Edema Management  forearm & elbow    Passive ROM  Elbow flex/ext, pronation/supination; attempted shoulder-severe pain             PT Education - 02/01/17 1548    Education provided  Yes    Education Details  cleaning skin, alignment of cast    Person(s) Educated  Patient    Methods  Explanation    Comprehension  Verbalized understanding       PT Short Term Goals - 01/21/17 1728      PT SHORT TERM GOAL #1   Title  passive GHJ  & elbow to full ROM v L side    Baseline  significantly limited-see flowsheet    Time  6    Period  Weeks    Status  New    Target Date  03/04/17        PT Long Term Goals - 01/21/17 1729      PT LONG TERM GOAL #1   Title  Pt will be able to carry objects such as plates of food and grocery bags without limitation by arm pain    Baseline  unable at eval    Time  10    Period  Weeks    Status  New    Target Date  04/02/17      PT LONG TERM GOAL #2   Title  Gross UE strength to 5/5 for proper support to biomechanical chain    Baseline  unable to test at eval due to restrictions    Time  10    Period  Weeks    Status  New    Target Date  04/02/17      PT LONG TERM GOAL #3   Title  Pt will be able to appropraitely use arm for all self care and work activities    Baseline  unable at eval    Time  10    Period  Weeks    Status  New    Target Date  04/02/17      PT LONG TERM GOAL #4   Title  FOTO to 29% limitation to indicate significant improvement in functional ability    Baseline  72% limited at eval    Time  10    Period  Weeks    Status  New    Target Date  04/02/17            Plan - 02/01/17 1545    Clinical Impression Statement  Pt less tolerable to passive stretching at elbow today. Attempted to passively range shoulder joint but pt lifted himself off of the table and moved away due to severe pain. Re-aligned cast today. Discussed importance of cleansing skin. Sees MD tomorrow.     PT Treatment/Interventions  ADLs/Self Care Home Management;Cryotherapy;Electrical Stimulation;Iontophoresis 4mg /ml Dexamethasone;Functional mobility training;Ultrasound;Moist Heat;Therapeutic activities;Therapeutic exercise;Neuromuscular re-education;Patient/family education;Passive range of motion;Manual techniques;Dry needling;Taping;Vasopneumatic Device    PT Next Visit Plan  passive elbow & shoulder ROM, wrist/forearm exercises, scapular retractions/cervical stretches; Continue to  progress as tolerated.     PT Home Exercise Plan  putty grip, self passive elbow extension    Consulted and Agree with Plan of Care  Patient       Patient will benefit from skilled therapeutic intervention in order to improve the following deficits and impairments:  Decreased range of motion, Impaired UE functional use, Increased muscle spasms, Decreased activity tolerance, Pain, Improper body mechanics, Impaired flexibility, Decreased strength, Decreased mobility, Postural dysfunction, Increased edema  Visit Diagnosis: Pain in right upper arm  Acute pain of right shoulder  Pain in right elbow  Muscle weakness (generalized)  Localized edema     Problem List There are no active problems to display for this patient.  Bilaal Leib C. Talin Feister PT, DPT 02/01/17 3:49 PM   [facility] [address] [place], [place], [address] Phone: [phone]   Fax:  [phone]  Name: Claudine Moutonmmanuel Pisarski MRN: [id number] Date of Birth: 02/09/1986

## 2017-02-02 DIAGNOSIS — S42301D Unspecified fracture of shaft of humerus, right arm, subsequent encounter for fracture with routine healing: Secondary | ICD-10-CM | POA: Diagnosis not present

## 2017-02-03 ENCOUNTER — Ambulatory Visit: Payer: BLUE CROSS/BLUE SHIELD | Admitting: Physical Therapy

## 2017-02-03 ENCOUNTER — Encounter: Payer: Self-pay | Admitting: Physical Therapy

## 2017-02-03 DIAGNOSIS — M25521 Pain in right elbow: Secondary | ICD-10-CM

## 2017-02-03 DIAGNOSIS — M6281 Muscle weakness (generalized): Secondary | ICD-10-CM | POA: Diagnosis not present

## 2017-02-03 DIAGNOSIS — M79621 Pain in right upper arm: Secondary | ICD-10-CM | POA: Diagnosis not present

## 2017-02-03 DIAGNOSIS — R600 Localized edema: Secondary | ICD-10-CM

## 2017-02-03 DIAGNOSIS — M25511 Pain in right shoulder: Secondary | ICD-10-CM

## 2017-02-03 NOTE — Therapy (Signed)
[facility] [address] [place], [place], [address] Phone: [phone]   Fax:  [phone]  Physical Therapy Treatment  Patient Details  Name: Eddie Lawrence Christianson MRN: [id number] Date of Birth: 08/09/1985 Referring Provider: Roby LoftsHaddix, Kevin P, MD   Encounter Date: 02/03/2017  PT End of Session - 02/03/17 1511    Visit Number  5    Number of Visits  21    Authorization Type  BCBS    PT Start Time  1508    PT Stop Time  1552    PT Time Calculation (min)  44 min    Activity Tolerance  Patient limited by pain    Behavior During Therapy  Restless       Past Medical History:  Diagnosis Date  . Asthma     History reviewed. No pertinent surgical history.  There were no vitals filed for this visit.  Subjective Assessment - 02/03/17 1511    Subjective  feels that brace limits ER/IR of arm.     Patient Stated Goals  using arm, extend elbow, works at [employer]    Currently in Pain?  Yes    Pain Score  4     Pain Location  Arm                      [facility] Adult PT Treatment/Exercise - 02/03/17 0001      Cryotherapy   Number Minutes Cryotherapy  10 Minutes    Cryotherapy Location  Upper arm    Type of Cryotherapy  Ice pack      Manual Therapy   Manual Therapy  Joint mobilization    Joint Mobilization  Gr2 elbow    Passive ROM  elbow extension, GHJ abd, flx               PT Short Term Goals - 01/21/17 1728      PT SHORT TERM GOAL #1   Title  passive GHJ & elbow to full ROM v L side    Baseline  significantly limited-see flowsheet    Time  6    Period  Weeks    Status  New    Target Date  03/04/17        PT Long Term Goals - 01/21/17 1729      PT LONG TERM GOAL #1   Title  Pt will be able to carry objects such as plates of food and grocery bags without limitation by arm pain    Baseline  unable at eval    Time  10    Period  Weeks    Status  New    Target Date  04/02/17      PT LONG TERM  GOAL #2   Title  Gross UE strength to 5/5 for proper support to biomechanical chain    Baseline  unable to test at eval due to restrictions    Time  10    Period  Weeks    Status  New    Target Date  04/02/17      PT LONG TERM GOAL #3   Title  Pt will be able to appropraitely use arm for all self care and work activities    Baseline  unable at eval    Time  10    Period  Weeks    Status  New    Target Date  04/02/17      PT LONG TERM GOAL #4  Title  FOTO to 29% limitation to indicate significant improvement in functional ability    Baseline  72% limited at eval    Time  10    Period  Weeks    Status  New    Target Date  04/02/17            Plan - 02/03/17 1542    Clinical Impression Statement  Pt was unable to tolerate PROM to shoulder in supine. C/o medial elbow pain at end range extension that was reduced with mobilizations. Attempted to use UE ranger for shoulder motion, pt c/o pain into shoulder and chest but was very tense and frequently tensed to pull away. Reports it feels good after we are finished. Updated referral from MD requests cont NWB in brace and agressive ROM of shoulder and elbow, xrays are lined up well. F/u in 3 weeks.     PT Treatment/Interventions  ADLs/Self Care Home Management;Cryotherapy;Electrical Stimulation;Iontophoresis 4mg /ml Dexamethasone;Functional mobility training;Ultrasound;Moist Heat;Therapeutic activities;Therapeutic exercise;Neuromuscular re-education;Patient/family education;Passive range of motion;Manual techniques;Dry needling;Taping;Vasopneumatic Device    PT Next Visit Plan  passive elbow & shoulder ROM, wrist/forearm exercises, scapular retractions/cervical stretches; Continue to progress as tolerated.     PT Home Exercise Plan  putty grip, self passive elbow extension    Consulted and Agree with Plan of Care  Patient       Patient will benefit from skilled therapeutic intervention in order to improve the following deficits and  impairments:  Decreased range of motion, Impaired UE functional use, Increased muscle spasms, Decreased activity tolerance, Pain, Improper body mechanics, Impaired flexibility, Decreased strength, Decreased mobility, Postural dysfunction, Increased edema  Visit Diagnosis: Pain in right upper arm  Acute pain of right shoulder  Pain in right elbow  Muscle weakness (generalized)  Localized edema     Problem List There are no active problems to display for this patient.  Ksean Vale C. Jessyca Sloan PT, DPT 02/03/17 3:46 PM   [facility] [address] [place], [place], [address] Phone: [phone]   Fax:  [phone]  Name: Eddie Lawrence Bigler MRN: [id number] Date of Birth: 08/06/1985

## 2017-02-08 ENCOUNTER — Ambulatory Visit: Payer: BLUE CROSS/BLUE SHIELD | Admitting: Physical Therapy

## 2017-02-08 DIAGNOSIS — R05 Cough: Secondary | ICD-10-CM | POA: Diagnosis not present

## 2017-02-10 ENCOUNTER — Encounter: Payer: Self-pay | Admitting: Physical Therapy

## 2017-02-10 ENCOUNTER — Ambulatory Visit: Payer: BLUE CROSS/BLUE SHIELD | Admitting: Physical Therapy

## 2017-02-10 DIAGNOSIS — M79621 Pain in right upper arm: Secondary | ICD-10-CM | POA: Diagnosis not present

## 2017-02-10 DIAGNOSIS — R600 Localized edema: Secondary | ICD-10-CM | POA: Diagnosis not present

## 2017-02-10 DIAGNOSIS — M25521 Pain in right elbow: Secondary | ICD-10-CM

## 2017-02-10 DIAGNOSIS — M6281 Muscle weakness (generalized): Secondary | ICD-10-CM | POA: Diagnosis not present

## 2017-02-10 DIAGNOSIS — M25511 Pain in right shoulder: Secondary | ICD-10-CM | POA: Diagnosis not present

## 2017-02-10 NOTE — Therapy (Signed)
[facility] [address] [place], [place], [address] Phone: [phone]   Fax:  [phone]  Physical Therapy Treatment  Patient Details  Name: Eddie Lawrence Encinas MRN: [id number] Date of Birth: 08/03/1985 Referring Provider: Roby LoftsHaddix, Kevin P, MD   Encounter Date: 02/10/2017  PT End of Session - 02/10/17 1645    Visit Number  6    Number of Visits  21    Date for PT Re-Evaluation  04/02/17    PT Start Time  1503    PT Stop Time  1606    PT Time Calculation (min)  63 min    Activity Tolerance  Patient tolerated treatment well    Behavior During Therapy  [facility] for tasks assessed/performed       Past Medical History:  Diagnosis Date  . Asthma     History reviewed. No pertinent surgical history.  There were no vitals filed for this visit.  Subjective Assessment - 02/10/17 1505    Subjective  Tries to move arm/  does exercises at at home.  Noted less fluid on the elbow.   Wear sling even for sleeping. Off for shower,  dressing,  PT.  Shoulder feels weak.    Currently in Pain?  Yes    Pain Score  4     Pain Location  Arm    Pain Orientation  Right    Pain Descriptors / Indicators  Sore weak    Pain Type  Acute pain    Pain Radiating Towards  r shoulder. elbow    Aggravating Factors   moving arm    Pain Relieving Factors  rest  ice    Effect of Pain on Daily Activities    Limited ADL,  wakes at night.         [facility] - 02/10/17 0001      PROM   Right Shoulder Flexion  120 Degrees heat helpful                  [facility] Adult PT Treatment/Exercise - 02/10/17 0001      Self-Care   Self-Care  Heat/Ice Application;Other Self-Care Comments    Other Self-Care Comments   anatomy, what has to happen to get movement.  preacutions,  He was afraid of injuring FX,   Hurt vs harm..  Patient worried he will look different,  smaller on right.  Muscles can get bigger when he is allowed to exercise.  joint nutrition  with movement,        Moist Heat Therapy   Moist Heat Location  -- arm concurrent with stretching      Cryotherapy   Number Minutes Cryotherapy  10 Minutes    Cryotherapy Location  Upper arm    Type of Cryotherapy  -- cold pack      Manual Therapy   Manual Therapy  Joint mobilization    Joint Mobilization  Gr2 elbow  with rolled pillowcase anteriorly  with movement into flexion.     Passive ROM  shoulder,  elbow  ROM  with Moist heat             PT Education - 02/10/17 1642    Education provided  Yes    Education Details  Self care,  precautions,   anatomy    Person(s) Educated  Patient    Methods  Explanation;Demonstration    Comprehension  Verbalized understanding       PT Short Term Goals - 01/21/17 1728  PT SHORT TERM GOAL #1   Title  passive GHJ & elbow to full ROM v L side    Baseline  significantly limited-see flowsheet    Time  6    Period  Weeks    Status  New    Target Date  03/04/17        PT Long Term Goals - 01/21/17 1729      PT LONG TERM GOAL #1   Title  Pt will be able to carry objects such as plates of food and grocery bags without limitation by arm pain    Baseline  unable at eval    Time  10    Period  Weeks    Status  New    Target Date  04/02/17      PT LONG TERM GOAL #2   Title  Gross UE strength to 5/5 for proper support to biomechanical chain    Baseline  unable to test at eval due to restrictions    Time  10    Period  Weeks    Status  New    Target Date  04/02/17      PT LONG TERM GOAL #3   Title  Pt will be able to appropraitely use arm for all self care and work activities    Baseline  unable at eval    Time  10    Period  Weeks    Status  New    Target Date  04/02/17      PT LONG TERM GOAL #4   Title  FOTO to 29% limitation to indicate significant improvement in functional ability    Baseline  72% limited at eval    Time  10    Period  Weeks    Status  New    Target Date  04/02/17            Plan -  02/10/17 1646    Clinical Impression Statement  Heat helpful with ROM as was explaining hurt vs harm with demo on skeleton the movements.  PROM right shoulder 120.   Elbow flexion and extension improved visually,  not measured.   4/10 soreness post session.    PT Treatment/Interventions  ADLs/Self Care Home Management;Cryotherapy;Electrical Stimulation;Iontophoresis 4mg /ml Dexamethasone;Functional mobility training;Ultrasound;Moist Heat;Therapeutic activities;Therapeutic exercise;Neuromuscular re-education;Patient/family education;Passive range of motion;Manual techniques;Dry needling;Taping;Vasopneumatic Device    PT Next Visit Plan  passive elbow & shoulder ROM, wrist/forearm exercises, scapular retractions/cervical stretches; Continue to progress as tolerated. Considre heat    PT Home Exercise Plan  putty grip, self passive elbow extension    Consulted and Agree with Plan of Care  Patient       Patient will benefit from skilled therapeutic intervention in order to improve the following deficits and impairments:     Visit Diagnosis: Pain in right upper arm  Acute pain of right shoulder  Pain in right elbow  Muscle weakness (generalized)  Localized edema     Problem List There are no active problems to display for this patient.   HARRIS,KAREN PTA 02/10/2017, 4:53 PM  [facility] [address] [place], [place], [address] Phone: [phone]   Fax:  [phone]  Name: Eddie Lawrence Yokley MRN: [id number] Date of Birth: 10/12/1985

## 2017-02-11 DIAGNOSIS — E878 Other disorders of electrolyte and fluid balance, not elsewhere classified: Secondary | ICD-10-CM | POA: Diagnosis not present

## 2017-02-15 ENCOUNTER — Encounter: Payer: Self-pay | Admitting: Physical Therapy

## 2017-02-15 ENCOUNTER — Ambulatory Visit: Payer: BLUE CROSS/BLUE SHIELD | Admitting: Physical Therapy

## 2017-02-15 DIAGNOSIS — M25521 Pain in right elbow: Secondary | ICD-10-CM | POA: Diagnosis not present

## 2017-02-15 DIAGNOSIS — M25511 Pain in right shoulder: Secondary | ICD-10-CM | POA: Diagnosis not present

## 2017-02-15 DIAGNOSIS — M6281 Muscle weakness (generalized): Secondary | ICD-10-CM | POA: Diagnosis not present

## 2017-02-15 DIAGNOSIS — M79621 Pain in right upper arm: Secondary | ICD-10-CM

## 2017-02-15 DIAGNOSIS — R600 Localized edema: Secondary | ICD-10-CM | POA: Diagnosis not present

## 2017-02-15 NOTE — Therapy (Signed)
[facility] [address] [place], [place], [address] Phone: [phone]   Fax:  [phone]  Physical Therapy Treatment  Patient Details  Name: Eddie Lawrence MRN: [id number] Date of Birth: 08/03/1985 Referring Provider: Roby LoftsHaddix, Kevin P, MD   Encounter Date: 02/15/2017  PT End of Session - 02/15/17 1800    Visit Number  7    Number of Visits  21    Date for PT Re-Evaluation  04/02/17    PT Start Time  1504    PT Stop Time  1548    PT Time Calculation (min)  44 min    Activity Tolerance  Patient tolerated treatment well    Behavior During Therapy  [facility] for tasks assessed/performed       Past Medical History:  Diagnosis Date  . Asthma     History reviewed. No pertinent surgical history.  There were no vitals filed for this visit.  Subjective Assessment - 02/15/17 1516    Subjective  Working on HEP,   elbow  moving better    Currently in Pain?  Yes    Pain Score  5     Pain Location  Arm    Pain Orientation  Right    Pain Descriptors / Indicators  Tightness;Sharp    Pain Type  Acute pain    Pain Radiating Towards  r shoulder ,  elbow    Aggravating Factors   random sharpness.      Pain Relieving Factors  rest ,  ice    Effect of Pain on Daily Activities  Limited ADL.  Wakes     Multiple Pain Sites  No         [facility] PT Assessment - 02/15/17 0001      PROM   Right Shoulder Flexion  120 Degrees    Right Shoulder ABduction  -- 66    Right Shoulder External Rotation  40 Degrees    Right Elbow Extension  -30                  [facility] Adult PT Treatment/Exercise - 02/15/17 0001      Elbow Exercises   Other elbow exercises  UE ranger  flexion AA,  moderate cues    Other elbow exercises  sitting hand slides along thigh,  AA      Moist Heat Therapy   Moist Heat Location  -- shoulder, elbow during manual      Manual Therapy   Manual Therapy  Soft tissue mobilization;Passive ROM    Manual therapy  comments  ROM improving    Soft tissue mobilization  scapula,  arm    Passive ROM  shoulder,  elbow  ROM  with Moist heat               PT Short Term Goals - 01/21/17 1728      PT SHORT TERM GOAL #1   Title  passive GHJ & elbow to full ROM v L side    Baseline  significantly limited-see flowsheet    Time  6    Period  Weeks    Status  New    Target Date  03/04/17        PT Long Term Goals - 01/21/17 1729      PT LONG TERM GOAL #1   Title  Pt will be able to carry objects such as plates of food and grocery bags without limitation by arm pain    Baseline  unable at eval    Time  10    Period  Weeks    Status  New    Target Date  04/02/17      PT LONG TERM GOAL #2   Title  Gross UE strength to 5/5 for proper support to biomechanical chain    Baseline  unable to test at eval due to restrictions    Time  10    Period  Weeks    Status  New    Target Date  04/02/17      PT LONG TERM GOAL #3   Title  Pt will be able to appropraitely use arm for all self care and work activities    Baseline  unable at eval    Time  10    Period  Weeks    Status  New    Target Date  04/02/17      PT LONG TERM GOAL #4   Title  FOTO to 29% limitation to indicate significant improvement in functional ability    Baseline  72% limited at eval    Time  10    Period  Weeks    Status  New    Target Date  04/02/17            Plan - 02/15/17 1800    Clinical Impression Statement  PROM improving see flow sheet.  No increased pain at end of session.    PT Next Visit Plan  passive elbow & shoulder ROM, wrist/forearm exercises, scapular retractions/cervical stretches; Continue to progress as tolerated. Considre heat    PT Home Exercise Plan  putty grip, self passive elbow extension    Consulted and Agree with Plan of Care  Patient       Patient will benefit from skilled therapeutic intervention in order to improve the following deficits and impairments:     Visit Diagnosis: Pain  in right upper arm  Acute pain of right shoulder  Pain in right elbow  Muscle weakness (generalized)  Localized edema     Problem List There are no active problems to display for this patient.   Lavelle Berland PTA 02/15/2017, 6:02 PM  [facility] [address] [place], [place], [address] Phone: [phone]   Fax:  [phone]  Name: Sayf Kerner MRN: [id number] Date of Birth: 02-19-1986

## 2017-02-17 ENCOUNTER — Encounter: Payer: Self-pay | Admitting: Physical Therapy

## 2017-02-17 ENCOUNTER — Ambulatory Visit: Payer: BLUE CROSS/BLUE SHIELD | Admitting: Physical Therapy

## 2017-02-17 DIAGNOSIS — M25511 Pain in right shoulder: Secondary | ICD-10-CM | POA: Diagnosis not present

## 2017-02-17 DIAGNOSIS — M79621 Pain in right upper arm: Secondary | ICD-10-CM | POA: Diagnosis not present

## 2017-02-17 DIAGNOSIS — M25521 Pain in right elbow: Secondary | ICD-10-CM | POA: Diagnosis not present

## 2017-02-17 DIAGNOSIS — R600 Localized edema: Secondary | ICD-10-CM

## 2017-02-17 DIAGNOSIS — M6281 Muscle weakness (generalized): Secondary | ICD-10-CM | POA: Diagnosis not present

## 2017-02-17 NOTE — Therapy (Signed)
[facility] [address] [place], [place], [address] Phone: [phone]   Fax:  [phone]  Physical Therapy Treatment  Patient Details  Name: Eddie Lawrence MRN: [id number] Date of Birth: 07/23/1985 Referring Provider: Roby LoftsHaddix, Kevin P, MD   Encounter Date: 02/17/2017  PT End of Session - 02/17/17 1512    Visit Number  8    Number of Visits  21    Date for PT Re-Evaluation  04/02/17    Authorization Type  BCBS    PT Start Time  1511 pt arrived late    PT Stop Time  1554    PT Time Calculation (min)  43 min    Activity Tolerance  Patient tolerated treatment well    Behavior During Therapy  [facility] for tasks assessed/performed       Past Medical History:  Diagnosis Date  . Asthma     History reviewed. No pertinent surgical history.  There were no vitals filed for this visit.  Subjective Assessment - 02/17/17 1512    Subjective  Pain is low level with occasional increase in mid humerus that lasts for a minute or two. Feeling a good amount of pain in shoulder now. Only pain in shoulder at end range extension.     Patient Stated Goals  using arm, extend elbow, works at [employer]    Currently in Pain?  Yes    Pain Score  2     Pain Location  Arm    Pain Orientation  Right    Pain Descriptors / Indicators  Clance BollSharp                      [facility] Adult PT Treatment/Exercise - 02/17/17 0001      Exercises   Exercises  Shoulder      Shoulder Exercises: Pulleys   Flexion  3 minutes    ABduction  3 minutes      Shoulder Exercises: Stretch   Other Shoulder Stretches  AA towel slide on table    Other Shoulder Stretches  upper trap & levator stretch      Moist Heat Therapy   Number Minutes Moist Heat  10 Minutes    Moist Heat Location  Shoulder      Manual Therapy   Passive ROM  shoulder & elbow all ranges               PT Short Term Goals - 01/21/17 1728      PT SHORT TERM GOAL #1   Title   passive GHJ & elbow to full ROM v L side    Baseline  significantly limited-see flowsheet    Time  6    Period  Weeks    Status  New    Target Date  03/04/17        PT Long Term Goals - 01/21/17 1729      PT LONG TERM GOAL #1   Title  Pt will be able to carry objects such as plates of food and grocery bags without limitation by arm pain    Baseline  unable at eval    Time  10    Period  Weeks    Status  New    Target Date  04/02/17      PT LONG TERM GOAL #2   Title  Gross UE strength to 5/5 for proper support to biomechanical chain    Baseline  unable to test at eval due  to restrictions    Time  10    Period  Weeks    Status  New    Target Date  04/02/17      PT LONG TERM GOAL #3   Title  Pt will be able to appropraitely use arm for all self care and work activities    Baseline  unable at eval    Time  10    Period  Weeks    Status  New    Target Date  04/02/17      PT LONG TERM GOAL #4   Title  FOTO to 29% limitation to indicate significant improvement in functional ability    Baseline  72% limited at eval    Time  10    Period  Weeks    Status  New    Target Date  04/02/17            Plan - 02/17/17 1532    Clinical Impression Statement  GHJ passive flexion to approx 120 with discomfort at end range today. Was able to tolerate pulleys for AAROM. Visits MD next Tues for f/u.     PT Treatment/Interventions  ADLs/Self Care Home Management;Cryotherapy;Electrical Stimulation;Iontophoresis 4mg /ml Dexamethasone;Functional mobility training;Ultrasound;Moist Heat;Therapeutic activities;Therapeutic exercise;Neuromuscular re-education;Patient/family education;Passive range of motion;Manual techniques;Dry needling;Taping;Vasopneumatic Device    PT Next Visit Plan  passive & AAROM elbow & shoulder, periscapular activation    PT Home Exercise Plan  putty grip, self passive elbow extension, towel slides on table       Patient will benefit from skilled therapeutic  intervention in order to improve the following deficits and impairments:  Decreased range of motion, Impaired UE functional use, Increased muscle spasms, Decreased activity tolerance, Pain, Improper body mechanics, Impaired flexibility, Decreased strength, Decreased mobility, Postural dysfunction, Increased edema  Visit Diagnosis: Pain in right upper arm  Acute pain of right shoulder  Pain in right elbow  Muscle weakness (generalized)  Localized edema     Problem List There are no active problems to display for this patient.   Janeliz Prestwood C. Sherril Heyward PT, DPT 02/17/17 3:46 PM   [facility] [address] [place], [place], [address] Phone: [phone]   Fax:  [phone]  Name: Eddie Lawrence MRN: [id number] Date of Birth: 08/20/1985

## 2017-02-23 DIAGNOSIS — S42301D Unspecified fracture of shaft of humerus, right arm, subsequent encounter for fracture with routine healing: Secondary | ICD-10-CM | POA: Diagnosis not present

## 2017-03-03 ENCOUNTER — Ambulatory Visit: Payer: BLUE CROSS/BLUE SHIELD | Attending: Student | Admitting: Physical Therapy

## 2017-03-03 ENCOUNTER — Encounter: Payer: Self-pay | Admitting: Physical Therapy

## 2017-03-03 DIAGNOSIS — R600 Localized edema: Secondary | ICD-10-CM | POA: Insufficient documentation

## 2017-03-03 DIAGNOSIS — M25511 Pain in right shoulder: Secondary | ICD-10-CM | POA: Insufficient documentation

## 2017-03-03 DIAGNOSIS — M79621 Pain in right upper arm: Secondary | ICD-10-CM | POA: Diagnosis not present

## 2017-03-03 DIAGNOSIS — M25521 Pain in right elbow: Secondary | ICD-10-CM | POA: Diagnosis not present

## 2017-03-03 DIAGNOSIS — M6281 Muscle weakness (generalized): Secondary | ICD-10-CM | POA: Diagnosis not present

## 2017-03-03 NOTE — Therapy (Signed)
[facility] [address] [place], [place], [address] Phone: [phone]   Fax:  [phone]  Physical Therapy Treatment  Patient Details  Name: Eddie Lawrence MRN: [id number] Date of Birth: 07/04/1985 Referring Provider: Roby LoftsHaddix, Kevin P, MD   Encounter Date: 03/03/2017  PT End of Session - 03/03/17 1420    Visit Number  9    Number of Visits  21    Date for PT Re-Evaluation  04/02/17    Authorization Type  BCBS    PT Start Time  1420 pt arrived late    PT Stop Time  1500    PT Time Calculation (min)  40 min    Activity Tolerance  Patient tolerated treatment well    Behavior During Therapy  [facility] for [facility] assessed/per[facility]       Past Medical History:  Diagnosis Date  . Asthma     History reviewed. No pertinent surgical history.  There were no vitals filed for this visit.  Subjective Assessment - 03/03/17 1420    Subjective  Pt reports follow up xray revealed healing of fracture, was encouraged to stop wearing sling. Still feel some pain mid-humerus with movement of upper extremity. Still feels something on superior aspect of shoulder, like a fluid or something. Something stopping my elbow from extending.     Patient Stated Goals  using arm, extend elbow, works at [employer]    Currently in Pain?  Yes    Pain Score  2     Pain Location  Arm    Pain Orientation  Right;Upper                      [facility] Adult PT Treatment/Exercise - 03/03/17 0001      Shoulder Exercises: Supine   Flexion  Other (comment) chest press to flx with wand      Shoulder Exercises: Seated   Extension  AAROM    Abduction  AAROM    Other Seated Exercises  UE ranger reaches      Shoulder Exercises: Prone   Other Prone Exercises  row to triceps extension      Shoulder Exercises: Pulleys   Flexion  3 minutes    ABduction  3 minutes      Manual Therapy   Soft tissue mobilization  Rt pectoralis    Passive ROM  GHJ flx                PT Short Term Goals - 01/21/17 1728      PT SHORT TERM GOAL #1   Title  passive GHJ & elbow to full ROM v L side    Baseline  significantly limited-see flowsheet    Time  6    Period  Weeks    Status  New    Target Date  03/04/17        PT Long Term Goals - 01/21/17 1729      PT LONG TERM GOAL #1   Title  Pt will be able to carry objects such as plates of food and grocery bags without limitation by arm pain    Baseline  unable at eval    Time  10    Period  Weeks    Status  New    Target Date  04/02/17      PT LONG TERM GOAL #2   Title  Gross UE strength to 5/5 for proper support to biomechanical chain    Baseline  unable to test at eval due to restrictions    Time  10    Period  Weeks    Status  New    Target Date  04/02/17      PT LONG TERM GOAL #3   Title  Pt will be able to appropraitely use arm for all self care and work activities    Baseline  unable at eval    Time  10    Period  Weeks    Status  New    Target Date  04/02/17      PT LONG TERM GOAL #4   Title  FOTO to 29% limitation to indicate significant improvement in functional ability    Baseline  72% limited at eval    Time  10    Period  Weeks    Status  New    Target Date  04/02/17            Plan - 03/03/17 1447    Clinical Impression Statement  Increased challenges in AAROM today which pt tolerated well. Rows to triceps kicks added in prone, significant difficulty noted in triceps activation. Able to demo near full PROM, limited by pectoralis tightness.     PT Treatment/Interventions  ADLs/Self Care Home Management;Cryotherapy;Electrical Stimulation;Iontophoresis 4mg /ml Dexamethasone;Functional mobility training;Ultrasound;Moist Heat;Therapeutic activities;Therapeutic exercise;Neuromuscular re-education;Patient/family education;Passive range of motion;Manual techniques;Dry needling;Taping;Vasopneumatic Device    PT Next Visit Plan  passive & AAROM elbow & shoulder,  periscapular activation    PT Home Exercise Plan  putty grip, self passive elbow extension, towel slides on table, flexion & abduction with wand    Consulted and Agree with Plan of Care  Patient       Patient will benefit from skilled therapeutic intervention in order to improve the following deficits and impairments:  Decreased range of motion, Impaired UE functional use, Increased muscle spasms, Decreased activity tolerance, Pain, Improper body mechanics, Impaired flexibility, Decreased strength, Decreased mobility, Postural dysfunction, Increased edema  Visit Diagnosis: Pain in right upper arm  Acute pain of right shoulder  Pain in right elbow  Muscle weakness (generalized)     Problem List There are no active problems to display for this patient.   Eddie Lawrence C. Eddie Lawrence PT, DPT 03/03/17 3:01 PM   [facility] [address] [place], [place], [address] Phone: [phone]   Fax:  [phone]  Name: Eddie Lawrence MRN: [id number] Date of Birth: 06/03/1985

## 2017-03-08 ENCOUNTER — Encounter: Payer: BLUE CROSS/BLUE SHIELD | Admitting: Physical Therapy

## 2017-03-10 ENCOUNTER — Ambulatory Visit: Payer: BLUE CROSS/BLUE SHIELD | Admitting: Physical Therapy

## 2017-03-15 ENCOUNTER — Ambulatory Visit: Payer: BLUE CROSS/BLUE SHIELD | Admitting: Physical Therapy

## 2017-03-15 ENCOUNTER — Encounter: Payer: Self-pay | Admitting: Physical Therapy

## 2017-03-15 DIAGNOSIS — M25511 Pain in right shoulder: Secondary | ICD-10-CM | POA: Diagnosis not present

## 2017-03-15 DIAGNOSIS — R600 Localized edema: Secondary | ICD-10-CM

## 2017-03-15 DIAGNOSIS — M79621 Pain in right upper arm: Secondary | ICD-10-CM

## 2017-03-15 DIAGNOSIS — M25521 Pain in right elbow: Secondary | ICD-10-CM | POA: Diagnosis not present

## 2017-03-15 DIAGNOSIS — M6281 Muscle weakness (generalized): Secondary | ICD-10-CM | POA: Diagnosis not present

## 2017-03-15 NOTE — Therapy (Signed)
[place], [place], [address] Phone: [phone]   Fax:  [phone]  Physical Therapy Treatment  Patient Details  Name: Eddie Lawrence MRN: [id number] Date of Birth: 11-03-1985 Referring Provider: Shona Needles, MD   Encounter Date: 03/15/2017  PT End of Session - 03/15/17 1824    Visit Number  10    Number of Visits  21    Date for PT Re-Evaluation  04/02/17    PT Start Time  1336    PT Stop Time  1436    PT Time Calculation (min)  60 min    Activity Tolerance  Patient tolerated treatment well    Behavior During Therapy  [facility] for tasks assessed/performed       Past Medical History:  Diagnosis Date  . Asthma     History reviewed. No pertinent surgical history.  There were no vitals filed for this visit.  Subjective Assessment - 03/15/17 1341    Subjective  Missed last week due to snow and oversleeping.  Mild pain when shoulder clicks  and locks.   Pain no longer going to elbow.  Pain no longer wakes.    Currently in Pain?  No/denies    Pain Score  -- mild at times when he moves a certain way   up to 4-4/81    Pain Location  Shoulder    Pain Orientation  Right;Upper    Pain Descriptors / Indicators  -- clicks and locks      Pain Type  Acute pain    Pain Radiating Towards  right shoulder    Pain Frequency  Intermittent    Aggravating Factors   random movements.  end range    Pain Relieving Factors  rest     Effect of Pain on Daily Activities  arm feels weak.     Multiple Pain Sites  -- elbow hurts seperately when stretched                      [facility] Adult PT Treatment/Exercise - 03/15/17 0001      Elbow Exercises   Elbow Extension  AAROM;PROM      Shoulder Exercises: Supine   Flexion  AAROM;Both;20 reps from chest press 20 X  cane  136 degrees    Other Supine Exercises  chest press 20 x      Shoulder Exercises: Standing   ABduction  AAROM;10 reps cane    Extension   10 reps;AAROM cane,   42      Shoulder Exercises: Pulleys   Flexion  3 minutes 106    ABduction  3 minutes some scaption 3/10 pain,  105      Cryotherapy   Number Minutes Cryotherapy  10 Minutes    Cryotherapy Location  Shoulder    Type of Cryotherapy  -- cold pack      Manual Therapy   Passive ROM  GHJ flx               PT Short Term Goals - 03/15/17 1407      PT SHORT TERM GOAL #1   Title  passive GHJ & elbow to full ROM v L side    Baseline  not yet full          PT Long Term Goals - 03/15/17 1404      PT LONG TERM GOAL #1   Title  Pt will be able to carry objects such as plates  of food and grocery bags without limitation by arm pain    Baseline  carrying light items with no pain ,, small groceries,  plates of food    Time  10    Period  Weeks    Status  Partially Met      PT LONG TERM GOAL #2   Title  Gross UE strength to 5/5 for proper support to biomechanical chain    Baseline  unable to , due to restrictions    Time  10    Period  Weeks    Status  On-going      PT LONG TERM GOAL #3   Title  Pt will be able to appropraitely use arm for all self care and work activities    Baseline  used to brush teeth with duration, mild pain  1 X today for 1st time    Time  10    Period  Weeks    Status  On-going      PT LONG TERM GOAL #4   Title  FOTO to 29% limitation to indicate significant improvement in functional ability    Time  10    Period  Weeks    Status  Unable to assess            Plan - 03/15/17 1827    Clinical Impression Statement  Parient continues to make progress toward his goals.  he is able to hold light grocreies and plate of food.   AA ROM 140 flexion   Horizontal add WNL,  see flow sheet for other measurements.    Pain 7/10 at end of exercise.  Pain decreased with rest and cold pack.      PT Next Visit Plan  passive & AAROM elbow & shoulder, periscapular activation,  See what MD says.    PT Home Exercise Plan  putty grip, self  passive elbow extension, towel slides on table, flexion & abduction with wand       Patient will benefit from skilled therapeutic intervention in order to improve the following deficits and impairments:     Visit Diagnosis: Pain in right upper arm  Acute pain of right shoulder  Pain in right elbow  Muscle weakness (generalized)  Localized edema     Problem List There are no active problems to display for this patient.   Lavontae Cornia PTA 03/15/2017, 6:31 PM  [facility] [address] [place], [place], [address] Phone: [phone]   Fax:  [phone]  Name: Eddie Lawrence MRN: [id number] Date of Birth: 03-30-1986

## 2017-03-16 DIAGNOSIS — S42301D Unspecified fracture of shaft of humerus, right arm, subsequent encounter for fracture with routine healing: Secondary | ICD-10-CM | POA: Diagnosis not present

## 2017-03-17 ENCOUNTER — Encounter: Payer: Self-pay | Admitting: Physical Therapy

## 2017-03-17 ENCOUNTER — Ambulatory Visit: Payer: BLUE CROSS/BLUE SHIELD | Admitting: Physical Therapy

## 2017-03-17 DIAGNOSIS — M25521 Pain in right elbow: Secondary | ICD-10-CM | POA: Diagnosis not present

## 2017-03-17 DIAGNOSIS — R600 Localized edema: Secondary | ICD-10-CM

## 2017-03-17 DIAGNOSIS — M79621 Pain in right upper arm: Secondary | ICD-10-CM | POA: Diagnosis not present

## 2017-03-17 DIAGNOSIS — M6281 Muscle weakness (generalized): Secondary | ICD-10-CM

## 2017-03-17 DIAGNOSIS — M25511 Pain in right shoulder: Secondary | ICD-10-CM | POA: Diagnosis not present

## 2017-03-17 NOTE — Patient Instructions (Signed)
Walk Up Exercise (Active/Assistive)    With elbow straight, use fingers to "crawl" up wall or door frame as far as possible. Hold _5-10_ seconds. Repeat _5-10_ times. Do _1 to 3___ sessions per day.  Copyright  VHI. All rights reserved.

## 2017-03-17 NOTE — Therapy (Signed)
[place], [place], [address] Phone: [phone]   Fax:  [phone]  Physical Therapy Treatment  Patient Details  Name: Eddie Lawrence MRN: [id number] Date of Birth: 1985/10/06 Referring Provider: Shona Needles, MD   Encounter Date: 03/17/2017  PT End of Session - 03/17/17 1525    PT Start Time  1950    PT Stop Time  1511    PT Time Calculation (min)  49 min    Activity Tolerance  Patient tolerated treatment well    Behavior During Therapy  [employer] for tasks assessed/performed       Past Medical History:  Diagnosis Date  . Asthma     History reviewed. No pertinent surgical history.  There were no vitals filed for this visit.  Subjective Assessment - 03/17/17 1512    Subjective  Saw MD.  Rhunette Croft Ray healing.  New script:  Advance to WBAT R UE, N0 brace needed.  Aggressive shoulder ROM,    Currently in Pain?  No/denies    Pain Score  -- can get higher pain with stretching    Pain Location  Shoulder    Pain Orientation  Upper    Pain Descriptors / Indicators  -- Pain    Pain Radiating Towards  right shoulder    Pain Frequency  Intermittent    Aggravating Factors   end range,     Pain Relieving Factors  rest,  heat ,  ice    Multiple Pain Sites  No         [facility] PT Assessment - 03/17/17 0001      PROM   Right Shoulder Flexion  143 Degrees                  [facility] Adult PT Treatment/Exercise - 03/17/17 0001      Shoulder Exercises: Supine   Horizontal ABduction  -- AA X 3 WNL    External Rotation  AAROM    Flexion  AAROM      Shoulder Exercises: Standing   Flexion  10 reps 2 sets 1 set scaption UE ranger    arm shakes.      Flexion Limitations  Also finger ladder 2 reps,  pain 7/10 in shoulder end range able to reach all levels      Shoulder Exercises: Pulleys   Flexion  3 minutes      Shoulder Exercises: Stretch   Table Stretch - Flexion  3 reps holding cable cross bar and  lowering body ,vs pulling on bar      Moist Heat Therapy   Number Minutes Moist Heat  10 Minutes    Moist Heat Location  Shoulder also used during some supine stretching      Manual Therapy   Manual Therapy  Joint mobilization    Joint Mobilization  Gr 2 posterior, inferior. glides    Soft tissue mobilization  soft tissue work right shoulder. tissue softened,  ROM improving             PT Education - 03/17/17 1508    Education provided  Yes    Education Details  HEP    Person(s) Educated  Patient    Methods  Demonstration;Verbal cues;Handout    Comprehension  Verbalized understanding;Returned demonstration       PT Short Term Goals - 03/15/17 1407      PT SHORT TERM GOAL #1   Title  passive GHJ & elbow to full ROM v  L side    Baseline  not yet full          PT Long Term Goals - 03/15/17 1404      PT LONG TERM GOAL #1   Title  Pt will be able to carry objects such as plates of food and grocery bags without limitation by arm pain    Baseline  carrying light items with no pain ,, small groceries,  plates of food    Time  10    Period  Weeks    Status  Partially Met      PT LONG TERM GOAL #2   Title  Gross UE strength to 5/5 for proper support to biomechanical chain    Baseline  unable to , due to restrictions    Time  10    Period  Weeks    Status  On-going      PT LONG TERM GOAL #3   Title  Pt will be able to appropraitely use arm for all self care and work activities    Baseline  used to brush teeth with duration, mild pain  1 X today for 1st time    Time  10    Period  Weeks    Status  On-going      PT LONG TERM GOAL #4   Title  FOTO to 29% limitation to indicate significant improvement in functional ability    Time  10    Period  Weeks    Status  Unable to assess            Plan - 03/17/17 1526    Clinical Impression Statement  Brace has been D/c.  i looked at the X rays and there are opportunities for continued healing ( In my best  assessment)  He can advance to WBAT.  MD wants aggressive ROM.  Pain was up to 7/10 during session and down to 1-2/10 post session.  140 PROM shoulder flexion.  No new goals met    PT Next Visit Plan     No brace needed,  aggressive shoulder ROM,  Progress to [facility] & AAROM elbow & shoulder, periscapular activation,     PT Home Exercise Plan  putty grip, self passive elbow extension, towel slides on table, flexion & abduction with wand,  wall walk at door    Consulted and Agree with Plan of Care  Patient       Patient will benefit from skilled therapeutic intervention in order to improve the following deficits and impairments:     Visit Diagnosis: Pain in right upper arm  Acute pain of right shoulder  Pain in right elbow  Muscle weakness (generalized)  Localized edema     Problem List There are no active problems to display for this patient.   HARRIS,KAREN   PTA 03/17/2017, 3:30 PM  [facility] [address] [place], [place], [address] Phone: [phone]   Fax:  [phone]  Name: Eddie Lawrence MRN: [id number] Date of Birth: 1986-03-24

## 2017-03-24 ENCOUNTER — Encounter: Payer: Self-pay | Admitting: Physical Therapy

## 2017-03-24 ENCOUNTER — Ambulatory Visit: Payer: BLUE CROSS/BLUE SHIELD | Admitting: Physical Therapy

## 2017-03-24 DIAGNOSIS — M6281 Muscle weakness (generalized): Secondary | ICD-10-CM

## 2017-03-24 DIAGNOSIS — R600 Localized edema: Secondary | ICD-10-CM | POA: Diagnosis not present

## 2017-03-24 DIAGNOSIS — M25521 Pain in right elbow: Secondary | ICD-10-CM | POA: Diagnosis not present

## 2017-03-24 DIAGNOSIS — M79621 Pain in right upper arm: Secondary | ICD-10-CM | POA: Diagnosis not present

## 2017-03-24 DIAGNOSIS — M25511 Pain in right shoulder: Secondary | ICD-10-CM | POA: Diagnosis not present

## 2017-03-24 NOTE — Therapy (Signed)
Eddie Lawrence, [place], [address] Phone: [phone]   Fax:  [phone]  Physical Therapy Treatment  Patient Details  Name: Eddie Lawrence MRN: [id number] Date of Birth: 1985/10/06 Referring Provider: Shona Needles, MD   Encounter Date: 03/24/2017  PT End of Session - 03/24/17 1503    Visit Number  12    Number of Visits  21    Date for PT Re-Evaluation  04/02/17    Authorization Type  BCBS    PT Start Time  1501    PT Stop Time  1543    PT Time Calculation (min)  42 min    Activity Tolerance  Patient tolerated treatment well    Behavior During Therapy  [facility] for tasks assessed/performed       Past Medical History:  Diagnosis Date  . Asthma     History reviewed. No pertinent surgical history.  There were no vitals filed for this visit.  Subjective Assessment - 03/24/17 1503    Subjective  slight pain in elbow. feeling good out of brace.     Patient Stated Goals  using arm, extend elbow, works at [employer]    Currently in Pain?  Yes    Pain Score  2     Pain Location  Elbow    Pain Orientation  Right    Pain Descriptors / Indicators  -- restricted    Aggravating Factors   movement of elbow    Pain Relieving Factors  rest, heat         [facility] PT Assessment - 03/24/17 0001      PROM   Right Shoulder Flexion  140 Degrees                  [facility] Adult PT Treatment/Exercise - 03/24/17 0001      Shoulder Exercises: Supine   Flexion  20 reps wand +4lb weight      Shoulder Exercises: Prone   Other Prone Exercises  row to triceps kick 2x10 1 lb    Other Prone Exercises  qped LUE WB plyoball circles      Shoulder Exercises: Standing   Other Standing Exercises  UE CKC on table     Other Standing Exercises  tennis ball bouncing      Shoulder Exercises: ROM/Strengthening   UBE (Upper Arm Bike)  3'/3' L1      Shoulder Exercises: Stretch   Internal Rotation Stretch  4 reps 10s  holds with strap    Table Stretch - Flexion  5 reps;10 seconds    Other Shoulder Stretches  door pec stretch      Manual Therapy   Passive ROM  GHJ all ranges               PT Short Term Goals - 03/15/17 1407      PT SHORT TERM GOAL #1   Title  passive GHJ & elbow to full ROM v L side    Baseline  not yet full          PT Long Term Goals - 03/15/17 1404      PT LONG TERM GOAL #1   Title  Pt will be able to carry objects such as plates of food and grocery bags without limitation by arm pain    Baseline  carrying light items with no pain ,, small groceries,  plates of food    Time  10    Period  Weeks    Status  Partially Met      PT LONG TERM GOAL #2   Title  Gross UE strength to 5/5 for proper support to biomechanical chain    Baseline  unable to , due to restrictions    Time  10    Period  Weeks    Status  On-going      PT LONG TERM GOAL #3   Title  Pt will be able to appropraitely use arm for all self care and work activities    Baseline  used to brush teeth with duration, mild pain  1 X today for 1st time    Time  10    Period  Weeks    Status  On-going      PT LONG TERM GOAL #4   Title  FOTO to 29% limitation to indicate significant improvement in functional ability    Time  10    Period  Weeks    Status  Unable to assess            Plan - 03/24/17 1543    Clinical Impression Statement  Significant increase in challenges today. Discussed soreness to be expected. Pt denies regular stretches at home and importance was emphasized. Will continue to progress as tolerated.     PT Treatment/Interventions  ADLs/Self Care Home Management;Cryotherapy;Electrical Stimulation;Iontophoresis 76m/ml Dexamethasone;Functional mobility training;Ultrasound;Moist Heat;Therapeutic activities;Therapeutic exercise;Neuromuscular re-education;Patient/family education;Passive range of motion;Manual techniques;Dry needling;Taping;Vasopneumatic Device    PT Next Visit Plan      No brace needed,  aggressive shoulder ROM,  Progress to WBAT, periscapular strengthening    PT Home Exercise Plan  putty grip, self passive elbow extension, towel slides on table, flexion & abduction with wand,  wall walk at door, door pec stretch, tennis ball bouncing    Consulted and Agree with Plan of Care  Patient       Patient will benefit from skilled therapeutic intervention in order to improve the following deficits and impairments:  Decreased range of motion, Impaired UE functional use, Increased muscle spasms, Decreased activity tolerance, Pain, Improper body mechanics, Impaired flexibility, Decreased strength, Decreased mobility, Postural dysfunction, Increased edema  Visit Diagnosis: Pain in right upper arm  Acute pain of right shoulder  Pain in right elbow  Muscle weakness (generalized)  Localized edema     Problem List There are no active problems to display for this patient.  Eddie Lawrence C. Kamara Allan PT, DPT 03/24/17 3:45 PM   [facility]1952 [address] StreetGSh[place] [address]Phone: [phone]  Fax:  [phone] Name: Eddie MalandMRN: [id number] of Birth: [birth year]

## 2017-03-25 ENCOUNTER — Ambulatory Visit: Payer: BLUE CROSS/BLUE SHIELD | Admitting: Physical Therapy

## 2017-03-25 ENCOUNTER — Encounter: Payer: Self-pay | Admitting: Physical Therapy

## 2017-03-25 DIAGNOSIS — M25521 Pain in right elbow: Secondary | ICD-10-CM | POA: Diagnosis not present

## 2017-03-25 DIAGNOSIS — M79621 Pain in right upper arm: Secondary | ICD-10-CM | POA: Diagnosis not present

## 2017-03-25 DIAGNOSIS — R600 Localized edema: Secondary | ICD-10-CM | POA: Diagnosis not present

## 2017-03-25 DIAGNOSIS — M6281 Muscle weakness (generalized): Secondary | ICD-10-CM

## 2017-03-25 DIAGNOSIS — M25511 Pain in right shoulder: Secondary | ICD-10-CM

## 2017-03-25 NOTE — Therapy (Signed)
[place], [place], [address] Phone: [phone]   Fax:  [phone]  Physical Therapy Treatment  Patient Details  Name: Eddie Lawrence MRN: [id number] Date of Birth: [birth year] Referring Provider: Shona Needles, MD   Encounter Date: 03/25/2017  PT End of Session - 03/25/17 1505    Visit Number  13    Number of Visits  21    Date for PT Re-Evaluation  04/02/17    Authorization Type  BCBS    PT Start Time  1505    PT Stop Time  1550    PT Time Calculation (min)  45 min    Activity Tolerance  Patient tolerated treatment well    Behavior During Therapy  [facility] for tasks assessed/performed       Past Medical History:  Diagnosis Date  . Asthma     History reviewed. No pertinent surgical history.  There were no vitals filed for this visit.  Subjective Assessment - 03/25/17 1505    Subjective  Pt reports shoulder is sore today. Had some stinging pain in shoulder while at work over night- rubs entire shoulder when describing location of pain.     Patient Stated Goals  using arm, extend elbow, works at [employer]    Currently in Pain?  Yes    Pain Score  3     Pain Location  Shoulder    Pain Orientation  Right    Pain Descriptors / Indicators  Sore stinging    Aggravating Factors   overuse    Pain Relieving Factors  rest         [facility] PT Assessment - 03/25/17 0001      PROM   Right Elbow Extension  -10                  [facility] Adult PT Treatment/Exercise - 03/25/17 0001      Shoulder Exercises: Supine   Flexion  AAROM;20 reps with wand      Shoulder Exercises: Standing   Protraction  Other (comment) wall push ups    Flexion Limitations  wall walks with yellow band around wrists    Other Standing Exercises  [facility] UE walks across table    Other Standing Exercises  lat press into green plyoball with rocking & reaching      Shoulder Exercises: Pulleys   Flexion  3 minutes      Shoulder Exercises: ROM/Strengthening   Other ROM/Strengthening Exercises  UE ranger flexion, level 44      Shoulder Exercises: Stretch   Table Stretch - Flexion  3 reps;10 seconds      Moist Heat Therapy   Number Minutes Moist Heat  10 Minutes    Moist Heat Location  Shoulder      Manual Therapy   Soft tissue mobilization  upper trap, deltoid, biceps, triceps    Passive ROM  GHJ all ranges               PT Short Term Goals - 03/15/17 1407      PT SHORT TERM GOAL #1   Title  passive GHJ & elbow to full ROM v L side    Baseline  not yet full          PT Long Term Goals - 03/15/17 1404      PT LONG TERM GOAL #1   Title  Pt will be able to carry objects such as plates of food and  grocery bags without limitation by arm pain    Baseline  carrying light items with no pain ,, small groceries,  plates of food    Time  10    Period  Weeks    Status  Partially Met      PT LONG TERM GOAL #2   Title  Gross UE strength to 5/5 for proper support to biomechanical chain    Baseline  unable to , due to restrictions    Time  10    Period  Weeks    Status  On-going      PT LONG TERM GOAL #3   Title  Pt will be able to appropraitely use arm for all self care and work activities    Baseline  used to brush teeth with duration, mild pain  1 X today for 1st time    Time  10    Period  Weeks    Status  On-going      PT LONG TERM GOAL #4   Title  FOTO to 29% limitation to indicate significant improvement in functional ability    Time  10    Period  Weeks    Status  Unable to assess            Plan - 03/25/17 1542    Clinical Impression Statement  pt with singificant soreness today that slowed his ability to perform exercises. Heavy cuing to reduce GHJ elevation. All RC tests negative and no popping/clicking/clunking in shoulder. Very little pain in elbow, most notable limitation in shoulder    PT Treatment/Interventions  ADLs/Self Care Home  Management;Cryotherapy;Electrical Stimulation;Iontophoresis 59m/ml Dexamethasone;Functional mobility training;Ultrasound;Moist Heat;Therapeutic activities;Therapeutic exercise;Neuromuscular re-education;Patient/family education;Passive range of motion;Manual techniques;Dry needling;Taping;Vasopneumatic Device    PT Next Visit Plan     No brace needed,  aggressive shoulder ROM,  Progress to WBAT, periscapular strengthening    PT Home Exercise Plan  putty grip, self passive elbow extension, towel slides on table, flexion & abduction with wand,  wall walk at door, door pec stretch, tennis ball bouncing    Consulted and Agree with Plan of Care  Patient       Patient will benefit from skilled therapeutic intervention in order to improve the following deficits and impairments:  Decreased range of motion, Impaired UE functional use, Increased muscle spasms, Decreased activity tolerance, Pain, Improper body mechanics, Impaired flexibility, Decreased strength, Decreased mobility, Postural dysfunction, Increased edema  Visit Diagnosis: Pain in right upper arm  Acute pain of right shoulder  Pain in right elbow  Muscle weakness (generalized)     Problem List There are no active problems to display for this patient.   Melburn Treiber C. Isabela Nardelli PT, DPT 03/25/17 3:45 PM   [facility] [address]Phone: [phone]  Fax:  [phone] Name: Eddie CalameMRN: [id number] of Birth: 510-20-1987

## 2017-04-05 ENCOUNTER — Encounter: Payer: Self-pay | Admitting: Physical Therapy

## 2017-04-05 ENCOUNTER — Ambulatory Visit: Payer: BLUE CROSS/BLUE SHIELD | Attending: Student | Admitting: Physical Therapy

## 2017-04-05 DIAGNOSIS — M6281 Muscle weakness (generalized): Secondary | ICD-10-CM | POA: Diagnosis not present

## 2017-04-05 DIAGNOSIS — M79621 Pain in right upper arm: Secondary | ICD-10-CM | POA: Diagnosis not present

## 2017-04-05 DIAGNOSIS — M25521 Pain in right elbow: Secondary | ICD-10-CM | POA: Insufficient documentation

## 2017-04-05 DIAGNOSIS — M25511 Pain in right shoulder: Secondary | ICD-10-CM | POA: Diagnosis not present

## 2017-04-05 NOTE — Therapy (Signed)
[place], [place], [address] Phone: [phone]   Fax:  [phone]  Physical Therapy Treatment/Discharge Summary  Patient Details  Name: Eddie Lawrence MRN: [id number] Date of Birth: Dec 24, 1985 Referring Provider: Katha Hamming, MD   Encounter Date: 04/05/2017  PT End of Session - 04/05/17 1420    Visit Number  14    Number of Visits  21    Date for PT Re-Evaluation  04/05/17    Authorization Type  BCBS    PT Start Time  4034 pt arrived late    PT Stop Time  1451    PT Time Calculation (min)  31 min    Activity Tolerance  Patient tolerated treatment well    Behavior During Therapy  [facility] for tasks assessed/performed       Past Medical History:  Diagnosis Date  . Asthma     History reviewed. No pertinent surgical history.  There were no vitals filed for this visit.  Subjective Assessment - 04/05/17 1422    Subjective  I feel very very good. the only thing is just some pain in my fractured area. I am now able to use it to drive just fine. A little pain in shoulder.     Patient Stated Goals  using arm, extend elbow, works at [employer] PT Assessment - 04/05/17 0001      Assessment   Medical Diagnosis  R humerus fracture    Referring Provider  Katha Hamming, MD      Observation/Other Assessments   Focus on Therapeutic Outcomes (FOTO)   35% limited      PROM   Overall PROM   Within functional limits for tasks performed                  [facility] Adult PT Treatment/Exercise - 04/05/17 0001      Exercises   Exercises  Other Exercises    Other Exercises   see pt instructions             PT Education - 04/05/17 1451    Education provided  Yes    Education Details  goals, FOTO, HEP    Person(s) Educated  Patient    Methods  Explanation;Demonstration;Tactile cues;Verbal cues;Handout    Comprehension  Verbalized understanding;Returned demonstration;Verbal cues  required;Tactile cues required       PT Short Term Goals - 04/05/17 1453      PT SHORT TERM GOAL #1   Title  passive GHJ & elbow to full ROM v L side    Baseline  full    Status  Achieved        PT Long Term Goals - 04/05/17 1424      PT LONG TERM GOAL #1   Title  Pt will be able to carry objects such as plates of food and grocery bags without limitation by arm pain    Baseline  able     Status  Achieved      PT LONG TERM GOAL #2   Title  Gross UE strength to 5/5 for proper support to biomechanical chain    Baseline  with exception of Rt UE ER 4/5    Status  Partially Met      PT LONG TERM GOAL #3   Title  Pt will be able to appropraitely use arm for all self care and work activities    Baseline  able  Status  Achieved      PT LONG TERM GOAL #4   Title  FOTO to 29% limitation to indicate significant improvement in functional ability    Baseline  35% limited, improved from 72%    Status  Not Met            Plan - 04/05/17 1451    Clinical Impression Statement  pt has made significant progress since beginning PT and verbalized readiness for d/c today. Still lacks strength as he transitions into full use of Rt UE in daily activities and was edcuated on importance of continued HEP. Pt verbalized comfort and understanding and was encouraged to contact us with any further questions.     PT Treatment/Interventions  ADLs/Self Care Home Management;Cryotherapy;Electrical Stimulation;Iontophoresis 40m/ml Dexamethasone;Functional mobility training;Ultrasound;Moist Heat;Therapeutic activities;Therapeutic exercise;Neuromuscular re-education;Patient/family education;Passive range of motion;Manual techniques;Dry needling;Taping;Vasopneumatic Device    PT Home Exercise Plan  putty grip, self passive elbow extension, towel slides on table, flexion & abduction with wand,  wall walk at door, door pec stretch, tennis ball bouncing    Consulted and Agree with Plan of Care  Patient        Patient will benefit from skilled therapeutic intervention in order to improve the following deficits and impairments:  Decreased range of motion, Impaired UE functional use, Increased muscle spasms, Decreased activity tolerance, Pain, Improper body mechanics, Impaired flexibility, Decreased strength, Decreased mobility, Postural dysfunction, Increased edema  Visit Diagnosis: Pain in right upper arm - Plan: PT plan of care cert/re-cert  Acute pain of right shoulder - Plan: PT plan of care cert/re-cert  Pain in right elbow - Plan: PT plan of care cert/re-cert  Muscle weakness (generalized) - Plan: PT plan of care cert/re-cert     Problem List There are no active problems to display for this patient. PHYSICAL THERAPY DISCHARGE SUMMARY  Visits from Start of Care: 14  Current functional level related to goals / functional outcomes: See above   Remaining deficits: See above   Education / Equipment: Anatomy of condition, POC, HEP, exercise form/rationale  Plan: Patient agrees to discharge.  Patient goals were partially met. Patient is being discharged due to being pleased with the current functional level.  ?????     Kharon Hixon C. Kolette Vey PT, DPT 04/05/17 2:57 PM   C[facility] CD[facility] Association174[address]GDover NAl[place] [address]Phone: [phone]  Fax:  [phone] Name: Eddie DeboldMRN: [id number] of Birth: 5Sep 22, 1987

## 2017-04-07 ENCOUNTER — Ambulatory Visit: Payer: BLUE CROSS/BLUE SHIELD | Admitting: Physical Therapy

## 2017-04-12 ENCOUNTER — Encounter: Payer: BLUE CROSS/BLUE SHIELD | Admitting: Physical Therapy

## 2017-04-14 ENCOUNTER — Encounter: Payer: BLUE CROSS/BLUE SHIELD | Admitting: Physical Therapy

## 2017-04-19 ENCOUNTER — Encounter: Payer: BLUE CROSS/BLUE SHIELD | Admitting: Physical Therapy

## 2017-04-21 ENCOUNTER — Encounter: Payer: BLUE CROSS/BLUE SHIELD | Admitting: Physical Therapy

## 2017-04-26 ENCOUNTER — Encounter: Payer: BLUE CROSS/BLUE SHIELD | Admitting: Physical Therapy

## 2017-04-28 ENCOUNTER — Encounter: Payer: BLUE CROSS/BLUE SHIELD | Admitting: Physical Therapy

## 2017-08-23 DIAGNOSIS — J4540 Moderate persistent asthma, uncomplicated: Secondary | ICD-10-CM | POA: Diagnosis not present

## 2018-03-17 DIAGNOSIS — J4520 Mild intermittent asthma, uncomplicated: Secondary | ICD-10-CM | POA: Diagnosis not present

## 2018-08-06 IMAGING — DX DG HUMERUS 2V *R*
4 series · 4 of 4 positions shown · non-contrast
Comparison: None.

CLINICAL DATA: Right arm pain after injury.  Initial encounter.

EXAM:
RIGHT HUMERUS - 2+ VIEW

[elbow lat]
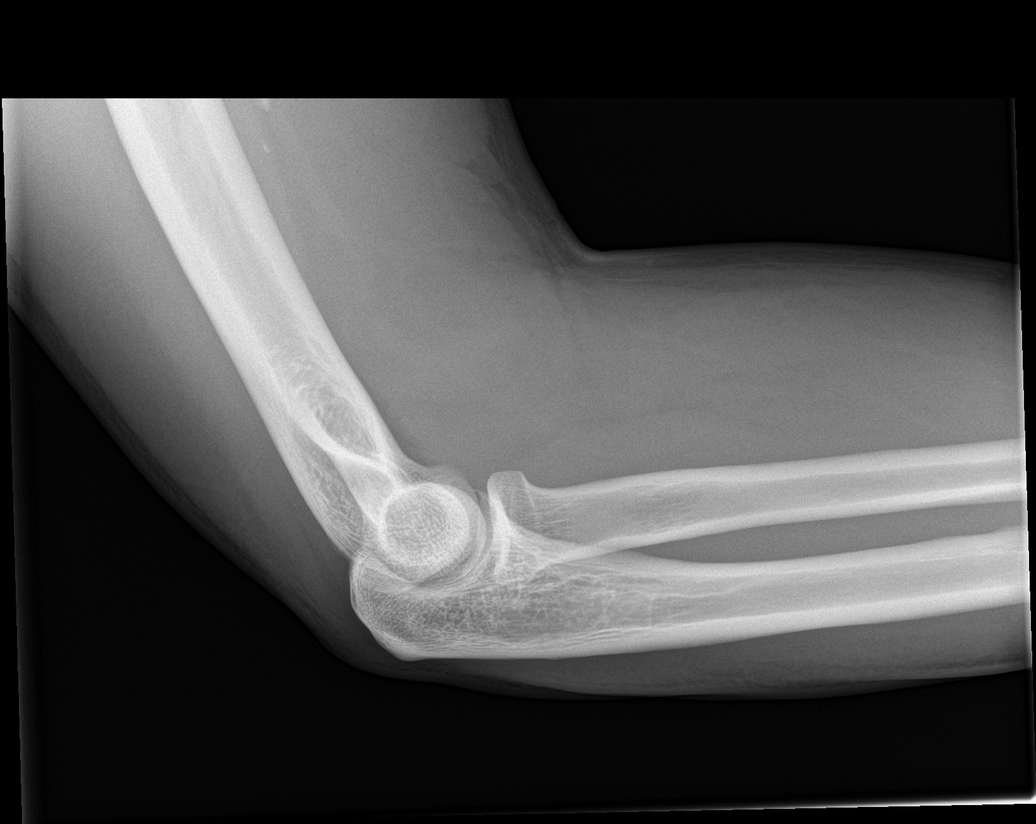

[humerus ap]
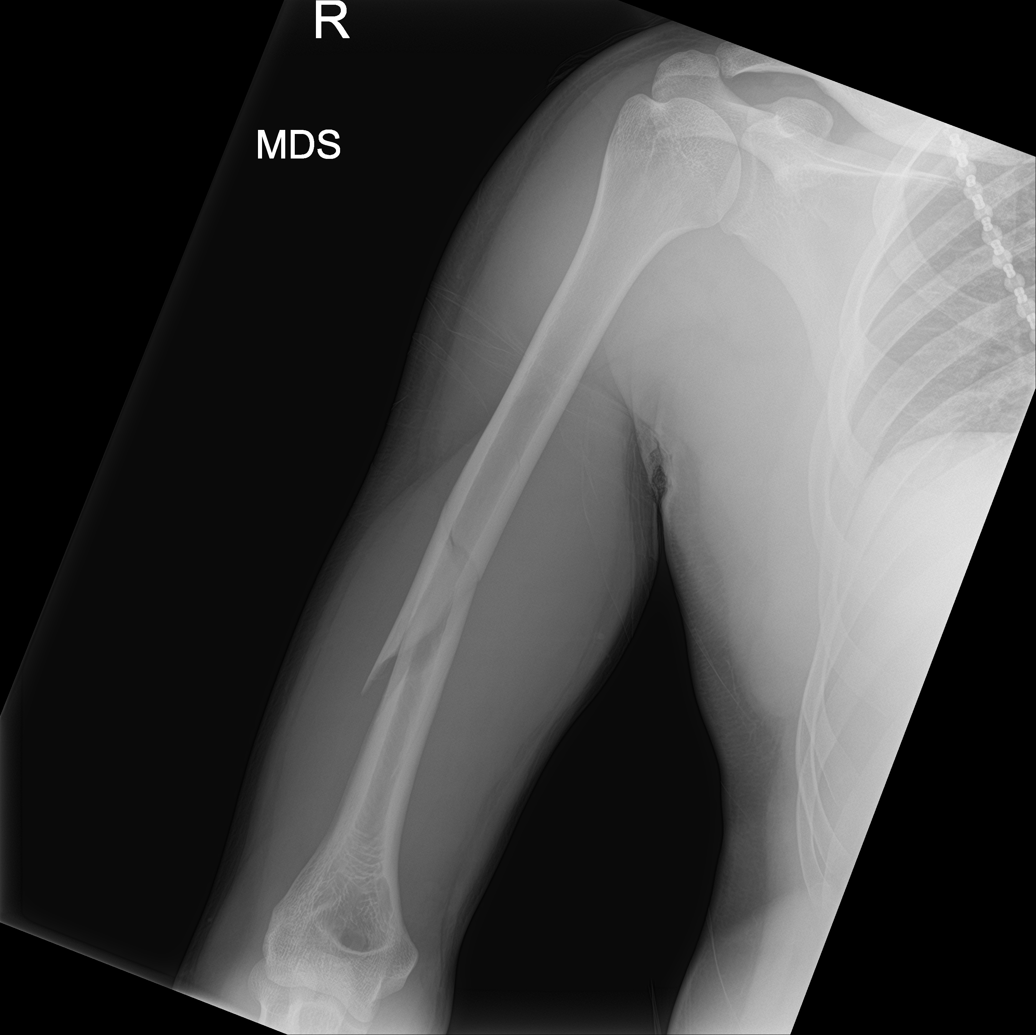

[humerus lat (1 of 2)]
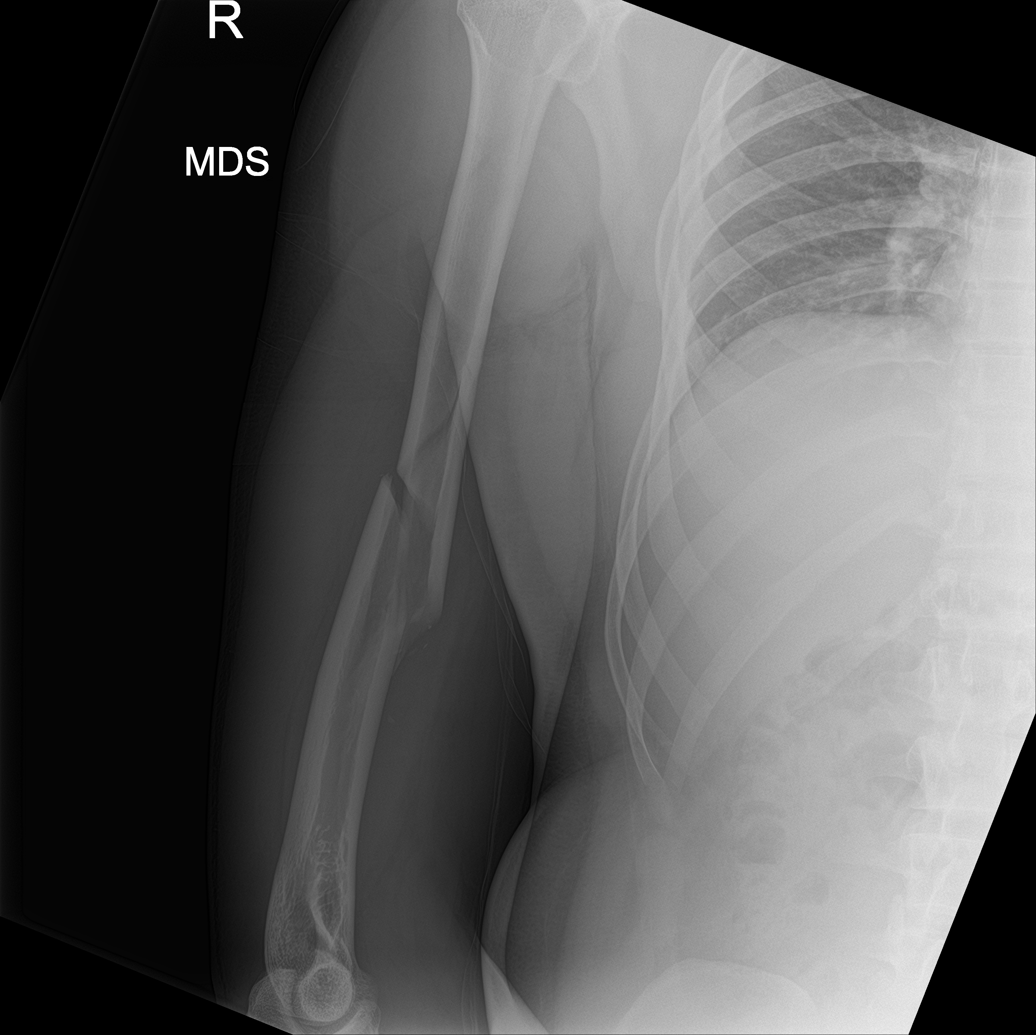

[humerus lat (2 of 2)]
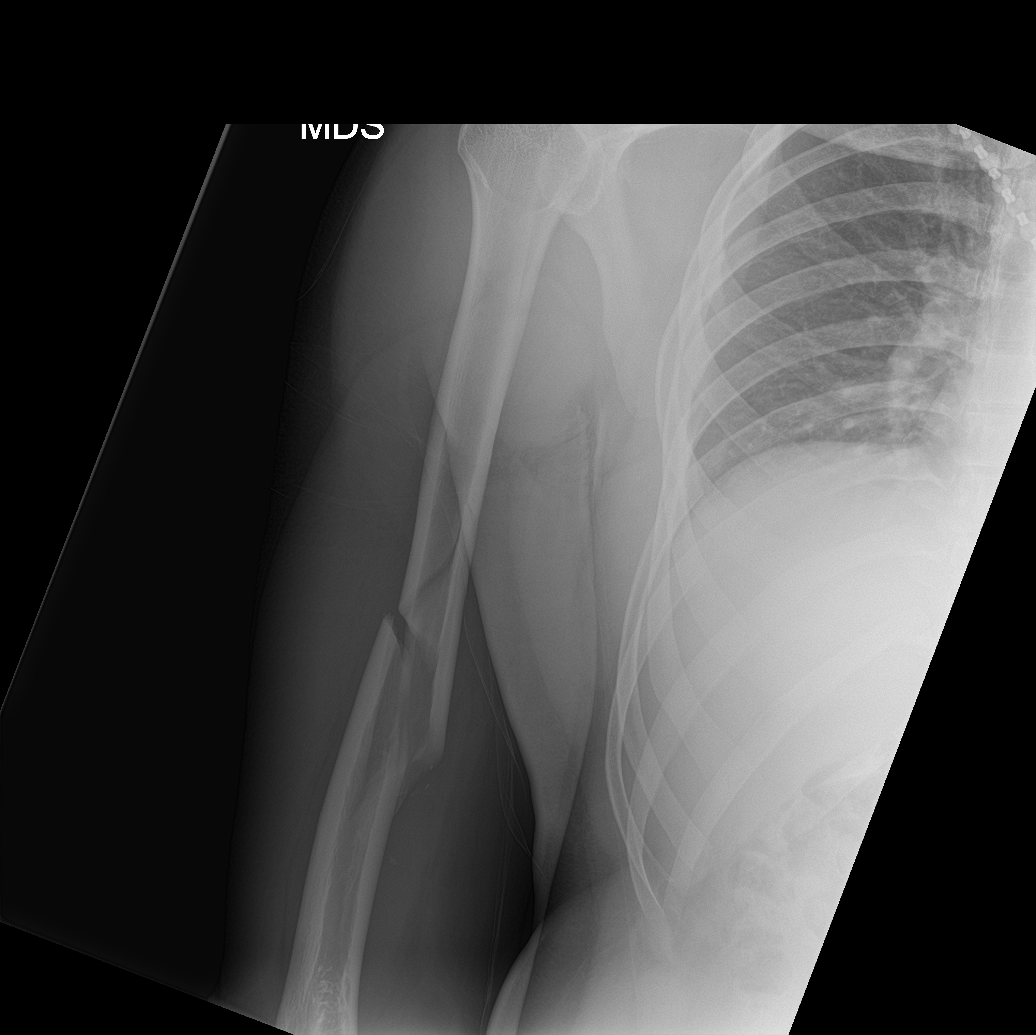

[4 of 4 positions shown; findings below may reference images not displayed]

FINDINGS: There is an oblique fracture through the midshaft of the right
humerus, with varying displacement, and posterior and medial
angulation. No elbow joint effusion is seen. The right humeral head
remains seated at the glenoid fossa. The right acromioclavicular
joint is grossly unremarkable. Soft tissue swelling is noted about
the fracture site.
IMPRESSION: Oblique fracture through the midshaft of the right humerus, with
posterior and medial angulation.
